# Patient Record
Sex: Female | Born: 1980 | Hispanic: Yes | Marital: Married | Smoking: Never smoker
Health system: Southern US, Community
[De-identification: ages and names within clinical notes are randomized; demographics above are authoritative.]

---

## 2003-06-18 ENCOUNTER — Emergency Department (HOSPITAL_COMMUNITY): Admission: EM | Admit: 2003-06-18 | Discharge: 2003-06-18 | Payer: Self-pay | Admitting: Emergency Medicine

## 2003-06-18 ENCOUNTER — Encounter: Payer: Self-pay | Admitting: Emergency Medicine

## 2005-12-10 ENCOUNTER — Other Ambulatory Visit: Payer: Self-pay

## 2005-12-10 ENCOUNTER — Emergency Department: Payer: Self-pay | Admitting: Emergency Medicine

## 2006-10-25 ENCOUNTER — Emergency Department: Payer: Self-pay

## 2007-01-09 ENCOUNTER — Emergency Department (HOSPITAL_COMMUNITY): Admission: EM | Admit: 2007-01-09 | Discharge: 2007-01-09 | Payer: Self-pay | Admitting: Family Medicine

## 2007-05-07 ENCOUNTER — Emergency Department (HOSPITAL_COMMUNITY): Admission: EM | Admit: 2007-05-07 | Discharge: 2007-05-07 | Payer: Self-pay | Admitting: *Deleted

## 2007-10-21 ENCOUNTER — Emergency Department: Payer: Self-pay | Admitting: Emergency Medicine

## 2008-09-21 ENCOUNTER — Emergency Department: Payer: Self-pay | Admitting: Emergency Medicine

## 2010-09-13 ENCOUNTER — Emergency Department: Payer: Self-pay | Admitting: Unknown Physician Specialty

## 2013-07-29 ENCOUNTER — Ambulatory Visit: Payer: Self-pay | Admitting: Adult Health

## 2015-03-28 ENCOUNTER — Emergency Department: Payer: Self-pay | Admitting: Emergency Medicine

## 2015-03-28 LAB — CBC
HCT: 40.8 % (ref 35.0–47.0)
HGB: 13.3 g/dL (ref 12.0–16.0)
MCH: 28.3 pg (ref 26.0–34.0)
MCHC: 32.6 g/dL (ref 32.0–36.0)
MCV: 87 fL (ref 80–100)
PLATELETS: 407 10*3/uL (ref 150–440)
RBC: 4.69 10*6/uL (ref 3.80–5.20)
RDW: 13.8 % (ref 11.5–14.5)
WBC: 8.5 10*3/uL (ref 3.6–11.0)

## 2015-03-28 LAB — BASIC METABOLIC PANEL
ANION GAP: 8 (ref 7–16)
BUN: 7 mg/dL
CALCIUM: 9.3 mg/dL
CHLORIDE: 106 mmol/L
Co2: 25 mmol/L
Creatinine: 0.74 mg/dL
EGFR (Non-African Amer.): >60
Glucose: 93 mg/dL
Potassium: 4.1 mmol/L
SODIUM: 139 mmol/L

## 2015-03-28 LAB — TROPONIN I: Troponin-I: <0.03 ng/mL

## 2015-10-14 ENCOUNTER — Emergency Department
Admission: EM | Admit: 2015-10-14 | Discharge: 2015-10-14 | Disposition: A | Discharge status: Home / Self Care | Payer: Self-pay | Attending: Emergency Medicine | Admitting: Emergency Medicine

## 2015-10-14 DIAGNOSIS — E669 Obesity, unspecified: Secondary | ICD-10-CM | POA: Insufficient documentation

## 2015-10-14 DIAGNOSIS — E876 Hypokalemia: Secondary | ICD-10-CM | POA: Insufficient documentation

## 2015-10-14 DIAGNOSIS — R110 Nausea: Secondary | ICD-10-CM | POA: Insufficient documentation

## 2015-10-14 DIAGNOSIS — R109 Unspecified abdominal pain: Secondary | ICD-10-CM

## 2015-10-14 DIAGNOSIS — Z3202 Encounter for pregnancy test, result negative: Secondary | ICD-10-CM | POA: Insufficient documentation

## 2015-10-14 DIAGNOSIS — R1084 Generalized abdominal pain: Secondary | ICD-10-CM | POA: Insufficient documentation

## 2015-10-14 DIAGNOSIS — R197 Diarrhea, unspecified: Secondary | ICD-10-CM | POA: Insufficient documentation

## 2015-10-14 LAB — URINALYSIS COMPLETE WITH MICROSCOPIC (ARMC ONLY)
BACTERIA UA: NONE SEEN
Bilirubin Urine: NEGATIVE
Glucose, UA: NEGATIVE mg/dL
LEUKOCYTES UA: NEGATIVE
Nitrite: NEGATIVE
PH: 5 (ref 5.0–8.0)
PROTEIN: 30 mg/dL — AB
Specific Gravity, Urine: 1.029 (ref 1.005–1.030)

## 2015-10-14 LAB — CBC WITH DIFFERENTIAL/PLATELET
Basophils Absolute: 0.1 10*3/uL (ref 0–0.1)
Basophils Relative: 1 %
EOS PCT: 1 %
Eosinophils Absolute: 0.1 10*3/uL (ref 0–0.7)
HCT: 37.9 % (ref 35.0–47.0)
Hemoglobin: 12.5 g/dL (ref 12.0–16.0)
LYMPHS ABS: 2.6 10*3/uL (ref 1.0–3.6)
LYMPHS PCT: 28 %
MCH: 28.2 pg (ref 26.0–34.0)
MCHC: 33.1 g/dL (ref 32.0–36.0)
MCV: 85.2 fL (ref 80.0–100.0)
MONOS PCT: 8 %
Monocytes Absolute: 0.8 10*3/uL (ref 0.2–0.9)
Neutro Abs: 6 10*3/uL (ref 1.4–6.5)
Neutrophils Relative %: 62 %
PLATELETS: 398 10*3/uL (ref 150–440)
RBC: 4.44 MIL/uL (ref 3.80–5.20)
RDW: 13.6 % (ref 11.5–14.5)
WBC: 9.6 10*3/uL (ref 3.6–11.0)

## 2015-10-14 LAB — COMPREHENSIVE METABOLIC PANEL
ALK PHOS: 79 U/L (ref 38–126)
ALT: 14 U/L (ref 14–54)
AST: 22 U/L (ref 15–41)
Albumin: 3.9 g/dL (ref 3.5–5.0)
Anion gap: 8 (ref 5–15)
BUN: 10 mg/dL (ref 6–20)
CALCIUM: 8.9 mg/dL (ref 8.9–10.3)
CHLORIDE: 107 mmol/L (ref 101–111)
CO2: 22 mmol/L (ref 22–32)
CREATININE: 0.7 mg/dL (ref 0.44–1.00)
Glucose, Bld: 117 mg/dL — ABNORMAL HIGH (ref 65–99)
Potassium: 3.2 mmol/L — ABNORMAL LOW (ref 3.5–5.1)
Sodium: 137 mmol/L (ref 135–145)
Total Bilirubin: 0.2 mg/dL — ABNORMAL LOW (ref 0.3–1.2)
Total Protein: 7.6 g/dL (ref 6.5–8.1)

## 2015-10-14 LAB — LIPASE, BLOOD: LIPASE: 35 U/L (ref 22–51)

## 2015-10-14 LAB — PREGNANCY, URINE: Preg Test, Ur: NEGATIVE

## 2015-10-14 MED ORDER — SIMETHICONE 80 MG PO CHEW: 80.0000 mg | CHEWABLE_TABLET | Freq: Four times a day (QID) | ORAL | Status: DC | PRN

## 2015-10-14 MED ORDER — POTASSIUM CHLORIDE CRYS ER 20 MEQ PO TBCR
40.0000 meq | EXTENDED_RELEASE_TABLET | Freq: Once | ORAL | Status: AC
  Administered 2015-10-14: 40 meq via ORAL

## 2015-10-14 MED ORDER — POTASSIUM CHLORIDE CRYS ER 20 MEQ PO TBCR
EXTENDED_RELEASE_TABLET | ORAL | Status: AC
  Administered 2015-10-14: 40 meq via ORAL
  Filled 2015-10-14: qty 2

## 2015-10-14 MED ORDER — KETOROLAC TROMETHAMINE 30 MG/ML IJ SOLN: 60.0000 mg | Freq: Once | INTRAMUSCULAR | Status: AC

## 2015-10-14 MED ORDER — KETOROLAC TROMETHAMINE 60 MG/2ML IM SOLN
INTRAMUSCULAR | Status: AC
  Administered 2015-10-14: 60 mg
  Filled 2015-10-14: qty 2

## 2015-10-14 MED ORDER — ONDANSETRON HCL 4 MG PO TABS: 4.0000 mg | ORAL_TABLET | Freq: Every day | ORAL | Status: AC | PRN

## 2015-10-14 MED ORDER — LOPERAMIDE HCL 2 MG PO TABS: 2.0000 mg | ORAL_TABLET | Freq: Four times a day (QID) | ORAL | Status: DC | PRN

## 2015-10-14 MED ORDER — KETOROLAC TROMETHAMINE 30 MG/ML IJ SOLN
30.0000 mg | Freq: Once | INTRAMUSCULAR | Status: DC
Start: 2015-10-14 — End: 2015-10-14

## 2015-10-14 NOTE — ED Notes (Signed)
Pt states left sided abd pain, states nausea, denies any vomiting

## 2015-10-14 NOTE — ED Notes (Signed)
Patient comes in complaining of abdominal pain that started 1 week ago and got worse today.  Pain is on the left upper quadrant and radiates to right side.  Patient with multiple loose stools, nausea with NO vomiting.

## 2015-10-14 NOTE — ED Provider Notes (Signed)
[facility] Emergency Department Provider Note  ____________________________________________  Time seen: Approximately 2:58 PM  I have reviewed the triage vital signs and the nursing notes.   HISTORY  Chief Complaint Abdominal Pain    HPI Tiffany Wu is a 34 y.o. female , status post BTL, presenting with left-sided abdominal pain. Patient reports that for the last week she has had 4-5 daily episodes of a sharp left-sided pain that lasts for several seconds and then resolves spontaneously. It is associated with nausea but no vomiting, with nonbloody diarrhea. She denies any food sensitivities, fever, chills, urinary symptoms including burning or increased frequency, change in vaginal discharge, no chest pain or shortness of breath..Patient is not having any pain or nausea at this time.   History reviewed. No pertinent past medical history.  There are no active problems to display for this patient.   History reviewed. No pertinent past surgical history.  Current Outpatient Rx  Name  Route  Sig  Dispense  Refill  . loperamide (IMODIUM A-D) 2 MG tablet   Oral   Take 1 tablet (2 mg total) by mouth 4 (four) times daily as needed for diarrhea or loose stools.   12 tablet   0   . ondansetron (ZOFRAN) 4 MG tablet   Oral   Take 1 tablet (4 mg total) by mouth daily as needed for nausea or vomiting.   15 tablet   0     Allergies Review of patient's allergies indicates no known allergies.  History reviewed. No pertinent family history.  Social History Social History  Substance Use Topics  . Smoking status: Never Smoker   . Smokeless tobacco: None  . Alcohol Use: No   patient is sexually active; status post BTL  Review of Systems Constitutional: No fever/chills. No syncope. Eyes: No visual changes. ENT: No sore throat. Cardiovascular: Denies chest pain, palpitations. Respiratory: Denies shortness of breath.  No cough. Gastrointestinal:  Positive abdominal pain.  Positive nausea without vomiting. Positive nonbloody diarrhea..  No constipation. Genitourinary: Negative for dysuria. Negative for urinary frequency, negative for change in vaginal discharge. Musculoskeletal: Negative for back pain. Skin: Negative for rash. Neurological: Negative for headaches, focal weakness or numbness.  10-point ROS otherwise negative.  ____________________________________________   PHYSICAL EXAM:  VITAL SIGNS: ED Triage Vitals  Enc Vitals Group     BP 10/14/15 1411 144/95 mmHg     Pulse Rate 10/14/15 1411 92     Resp 10/14/15 1411 16     Temp 10/14/15 1411 98.6 F (37 C)     Temp Source 10/14/15 1411 Oral     SpO2 10/14/15 1411 99 %     Weight 10/14/15 1411 200 lb (90.719 kg)     Height 10/14/15 1411 5\' 2"  (1.575 m)     Head Cir --      Peak Flow --      Pain Score 10/14/15 1411 8     Pain Loc --      Pain Edu? --      Excl. in GC? --     Constitutional: Alert and oriented. Well appearing and in no acute distress. Answer question appropriately. Eyes: Conjunctivae are normal.  EOMI. no scleral icterus. Head: Atraumatic. Nose: No congestion/rhinnorhea. Mouth/Throat: Mucous membranes are moist.  Neck: No stridor.  Supple.   Cardiovascular: Normal rate, regular rhythm. No murmurs, rubs or gallops.  Respiratory: Normal respiratory effort.  No retractions. Lungs CTAB.  No wheezes, rales or ronchi. Gastrointestinal: Obese, soft, nondistended.  Diffuse nonfocal tenderness in the left side, left lower quadrant greater than left upper quadrant. Musculoskeletal: No LE edema.  Neurologic:  Normal speech and language. No gross focal neurologic deficits are appreciated.  Skin:  Skin is warm, dry and intact. No rash noted. Psychiatric: Mood and affect are normal. Speech and behavior are normal.  Normal judgement.  ____________________________________________   LABS (all labs ordered are listed, but only abnormal results are  displayed)  Labs Reviewed  COMPREHENSIVE METABOLIC PANEL - Abnormal; Notable for the following:    Potassium 3.2 (*)    Glucose, Bld 117 (*)    Total Bilirubin 0.2 (*)    All other components within normal limits  URINALYSIS COMPLETEWITH MICROSCOPIC ([facility] ONLY) - Abnormal; Notable for the following:    Color, Urine YELLOW (*)    APPearance HAZY (*)    Ketones, ur TRACE (*)    Hgb urine dipstick 1+ (*)    Protein, ur 30 (*)    Squamous Epithelial / LPF 0-5 (*)    All other components within normal limits  CBC WITH DIFFERENTIAL/PLATELET  LIPASE, BLOOD  PREGNANCY, URINE   ____________________________________________  EKG  Not indicated ____________________________________________  RADIOLOGY  No results found.  ____________________________________________   PROCEDURES  Procedure(s) performed: None  Critical Care performed: No ____________________________________________   INITIAL IMPRESSION / ASSESSMENT AND PLAN / ED COURSE  Pertinent labs & imaging results that were available during my care of the patient were reviewed by me and considered in my medical decision making (see chart for details).  34 y.o. female, status post BTL, presenting with left-sided abdominal pain. It is worse when she eats. Given the brevity of the episodes, consider a food sensitivity, as well as gas. Diverticulitis is possible but less likely. No evidence of appendicitis or gallbladder disease. We'll get lab work and if it is reassuring, the patient started simethicone and follow up with her PMD. If the labs are abnormal, we will consider additional workup at that time. At this time the patient is symptom-free so no further treatment is indicated.  ----------------------------------------- 3:43 PM on 10/14/2015 -----------------------------------------  Patient is well-appearing and I have low clinical suspicion for an acute surgical or admissible intra-abdominal pathology. The patient has  labs that are reassuring with a normal white blood cell count. She is mildly hypokalemic so did supplement that. She does not have a UTI.  I'll plan to send her home with antidiarrheal, anti-emetic, and instructions to follow up with her primary care physician. ____________________________________________  FINAL CLINICAL IMPRESSION(S) / ED DIAGNOSES  Final diagnoses:  Left sided abdominal pain  Hypokalemia  Diarrhea, unspecified type  Nausea      NEW MEDICATIONS STARTED DURING THIS VISIT:  New Prescriptions   LOPERAMIDE (IMODIUM A-D) 2 MG TABLET    Take 1 tablet (2 mg total) by mouth 4 (four) times daily as needed for diarrhea or loose stools.   ONDANSETRON (ZOFRAN) 4 MG TABLET    Take 1 tablet (4 mg total) by mouth daily as needed for nausea or vomiting.     Rockne Menghini, MD 10/14/15 1544

## 2015-10-14 NOTE — Discharge Instructions (Signed)
[facility] call the [facility] to make an appointment to establish a primary care physician and to discuss the follow-up of her abdominal pain. Your potassium was low today and will need to be rechecked as well.  Please return to the emergency department if he develops worsened abdominal pain, fever, inability to keep down fluids, lightheadedness or fainting, or any other symptoms concerning to you.  Abdominal Pain, Adult Many things can cause abdominal pain. Usually, abdominal pain is not caused by a disease and will improve without treatment. It can often be observed and treated at home. Your health care provider will do a physical exam and possibly order blood tests and X-rays to help determine the seriousness of your pain. However, in many cases, more time must pass before a clear cause of the pain can be found. Before that point, your health care provider may not know if you need more testing or further treatment. HOME CARE INSTRUCTIONS Monitor your abdominal pain for any changes. The following actions may help to alleviate any discomfort you are experiencing:  Only take over-the-counter or prescription medicines as directed by your health care provider.  Do not take laxatives unless directed to do so by your health care provider.  Try a clear liquid diet (broth, tea, or water) as directed by your health care provider. Slowly move to a bland diet as tolerated. SEEK MEDICAL CARE IF:  You have unexplained abdominal pain.  You have abdominal pain associated with nausea or diarrhea.  You have pain when you urinate or have a bowel movement.  You experience abdominal pain that wakes you in the night.  You have abdominal pain that is worsened or improved by eating food.  You have abdominal pain that is worsened with eating fatty foods.  You have a fever. SEEK IMMEDIATE MEDICAL CARE IF:  Your pain does not go away within 2 hours.  You keep throwing up (vomiting).  Your pain is felt  only in portions of the abdomen, such as the right side or the left lower portion of the abdomen.  You pass bloody or black tarry stools. MAKE SURE YOU:  Understand these instructions.  Will watch your condition.  Will get help right away if you are not doing well or get worse.   This information is not intended to replace advice given to you by your health care provider. Make sure you discuss any questions you have with your health care provider.   Document Released: 09/26/2005 Document Revised: 09/07/2015 Document Reviewed: 08/26/2013 Elsevier Interactive Patient Education [employer]2016 [employer].

## 2016-09-02 ENCOUNTER — Emergency Department: Payer: BLUE CROSS/BLUE SHIELD

## 2016-09-02 ENCOUNTER — Emergency Department
Admission: EM | Admit: 2016-09-02 | Discharge: 2016-09-02 | Disposition: A | Discharge status: Home / Self Care | Payer: BLUE CROSS/BLUE SHIELD | Attending: Emergency Medicine | Admitting: Emergency Medicine

## 2016-09-02 ENCOUNTER — Encounter: Payer: Self-pay | Admitting: Emergency Medicine

## 2016-09-02 DIAGNOSIS — R079 Chest pain, unspecified: Secondary | ICD-10-CM | POA: Diagnosis not present

## 2016-09-02 DIAGNOSIS — R0789 Other chest pain: Secondary | ICD-10-CM | POA: Diagnosis present

## 2016-09-02 LAB — CBC
HCT: 36.9 % (ref 35.0–47.0)
HEMOGLOBIN: 12.8 g/dL (ref 12.0–16.0)
MCH: 29.4 pg (ref 26.0–34.0)
MCHC: 34.6 g/dL (ref 32.0–36.0)
MCV: 85.2 fL (ref 80.0–100.0)
Platelets: 377 10*3/uL (ref 150–440)
RBC: 4.34 MIL/uL (ref 3.80–5.20)
RDW: 13.6 % (ref 11.5–14.5)
WBC: 9 10*3/uL (ref 3.6–11.0)

## 2016-09-02 LAB — BASIC METABOLIC PANEL
Anion gap: 4 — ABNORMAL LOW (ref 5–15)
BUN: 9 mg/dL (ref 6–20)
CALCIUM: 9 mg/dL (ref 8.9–10.3)
CO2: 26 mmol/L (ref 22–32)
CREATININE: 0.72 mg/dL (ref 0.44–1.00)
Chloride: 108 mmol/L (ref 101–111)
GFR calc non Af Amer: >60 mL/min (ref >60)
GLUCOSE: 95 mg/dL (ref 65–99)
POTASSIUM: 3.7 mmol/L (ref 3.5–5.1)
SODIUM: 138 mmol/L (ref 135–145)

## 2016-09-02 LAB — TROPONIN I

## 2016-09-02 LAB — POCT PREGNANCY, URINE: Preg Test, Ur: NEGATIVE

## 2016-09-02 MED ORDER — KETOROLAC TROMETHAMINE 60 MG/2ML IM SOLN
60.0000 mg | Freq: Once | INTRAMUSCULAR | Status: AC
  Administered 2016-09-02: 60 mg via INTRAMUSCULAR
  Filled 2016-09-02: qty 2

## 2016-09-02 MED ORDER — RANITIDINE HCL 150 MG PO TABS: 150.0000 mg | ORAL_TABLET | Freq: Two times a day (BID) | ORAL | 1 refills | Status: DC

## 2016-09-02 MED ORDER — GI COCKTAIL ~~LOC~~
30.0000 mL | Freq: Once | ORAL | Status: AC
  Administered 2016-09-02: 30 mL via ORAL
  Filled 2016-09-02: qty 30

## 2016-09-02 MED ORDER — SUCRALFATE 1 G PO TABS: 1.0000 g | ORAL_TABLET | Freq: Four times a day (QID) | ORAL | 0 refills | Status: DC

## 2016-09-02 NOTE — Discharge Instructions (Signed)
Please seek medical attention for any high fevers, chest pain, shortness of breath, change in behavior, persistent vomiting, bloody stool or any other new or concerning symptoms.  

## 2016-09-02 NOTE — ED Triage Notes (Signed)
Pt with right chest pain (pressure) 8/10 that started today at 0100. Deep breathing, laying down makes pain worse.

## 2016-09-02 NOTE — ED Provider Notes (Signed)
[facility] Emergency Department Provider Note   ____________________________________________   I have reviewed the triage vital signs and the nursing notes.   HISTORY  Chief Complaint Chest Pain   History limited by: Not Limited   HPI Tiffany Wu is a 35 y.o. female who presents to the emergency department today because of concerns for right sided chest pain that started roughly 14 hours ago. She describes it as pressure-like. No left-sided pain. Somewhat worse with deep breaths. Not affected by position or eating. No shortness of breath. No fevers. No upper or lower extremity swelling. No recent travel. No history of blood clots. Patient is not on any estrogen.    History reviewed. No pertinent past medical history.  There are no active problems to display for this patient.   History reviewed. No pertinent surgical history.  Prior to Admission medications   Medication Sig Start Date End Date Taking? Authorizing Provider  loperamide (IMODIUM A-D) 2 MG tablet Take 1 tablet (2 mg total) by mouth 4 (four) times daily as needed for diarrhea or loose stools. 10/14/15   Anne-Caroline Sharma Covert, MD  ondansetron (ZOFRAN) 4 MG tablet Take 1 tablet (4 mg total) by mouth daily as needed for nausea or vomiting. 10/14/15 10/13/16  Anne-Caroline Sharma Covert, MD  simethicone (GAS-X) 80 MG chewable tablet Chew 1 tablet (80 mg total) by mouth 4 (four) times daily as needed for flatulence. 10/14/15 10/13/16  Rockne Menghini, MD    Allergies Review of patient's allergies indicates no known allergies.  History reviewed. No pertinent family history.  Social History Social History  Substance Use Topics  . Smoking status: Never Smoker  . Smokeless tobacco: Not on file  . Alcohol use No    Review of Systems  Constitutional: Negative for fever. Cardiovascular: Positive for right-sided chest pain Respiratory: Negative for shortness of breath. Gastrointestinal:  Negative for abdominal pain, vomiting and diarrhea. Genitourinary: Negative for dysuria. Musculoskeletal: Negative for back pain. Skin: Negative for rash. Neurological: Negative for headaches, focal weakness or numbness.  10-point ROS otherwise negative.  ____________________________________________   PHYSICAL EXAM:  VITAL SIGNS: ED Triage Vitals  Enc Vitals Group     BP 09/02/16 1305 (!) 145/94     Pulse Rate 09/02/16 1305 85     Resp 09/02/16 1429 16     Temp 09/02/16 1305 98.5 F (36.9 C)     Temp Source 09/02/16 1305 Oral     SpO2 09/02/16 1305 98 %     Weight 09/02/16 1306 198 lb (89.8 kg)     Height 09/02/16 1306 5\' 2"  (1.575 m)     Head Circumference --      Peak Flow --      Pain Score 09/02/16 1306 8   Constitutional: Alert and oriented. Well appearing and in no distress. Eyes: Conjunctivae are normal. Normal extraocular movements. ENT   Head: Normocephalic and atraumatic.   Nose: No congestion/rhinnorhea.   Mouth/Throat: Mucous membranes are moist.   Neck: No stridor. Hematological/Lymphatic/Immunilogical: No cervical lymphadenopathy. Cardiovascular: Normal rate, regular rhythm.  No murmurs, rubs, or gallops. Respiratory: Normal respiratory effort without tachypnea nor retractions. Breath sounds are clear and equal bilaterally. No wheezes/rales/rhonchi. Gastrointestinal: Soft and nontender. No distention.  Genitourinary: Deferred Musculoskeletal: Normal range of motion in all extremities. No lower extremity edema. Neurologic:  Normal speech and language. No gross focal neurologic deficits are appreciated.  Skin:  Skin is warm, dry and intact. No rash noted. Psychiatric: Mood and affect are normal. Speech and behavior  are normal. Patient exhibits appropriate insight and judgment.  ____________________________________________    LABS (pertinent positives/negatives)  Labs Reviewed  BASIC METABOLIC PANEL - Abnormal; Notable for the following:        Result Value   Anion gap 4 (*)    All other components within normal limits  CBC  TROPONIN I     ____________________________________________   EKG  I, Phineas SemenGraydon Vila Dory, attending physician, personally viewed and interpreted this EKG  EKG Time: 1253 Rate: 86 Rhythm: normal sinus rhythm Axis: normal Intervals: qtc 423 QRS: narrow ST changes: no st elevation Impression: normal ekg   ____________________________________________    RADIOLOGY  CXR IMPRESSION:  No evidence of acute cardiopulmonary disease.   __________________________________________   PROCEDURES  Procedures  ____________________________________________   INITIAL IMPRESSION / ASSESSMENT AND PLAN / ED COURSE  Pertinent labs & imaging results that were available during my care of the patient were reviewed by me and considered in my medical decision making (see chart for details).  Patient presented to the emergency department today with right-sided chest pain. Chest x-ray EKG and blood work without concerning findings. Patient is perc negative.  Clinical Course   Patient felt better after GI cocktail. Will plan on discharging with an acid as well as sucralfate. ____________________________________________   FINAL CLINICAL IMPRESSION(S) / ED DIAGNOSES  Final diagnoses:  Nonspecific chest pain     Note: This dictation was prepared with Dragon dictation. Any transcriptional errors that result from this process are unintentional    Phineas SemenGraydon Beaumont Austad, MD 09/02/16 1712

## 2017-06-16 ENCOUNTER — Encounter: Payer: Self-pay | Admitting: Emergency Medicine

## 2017-06-16 ENCOUNTER — Emergency Department: Payer: BLUE CROSS/BLUE SHIELD

## 2017-06-16 ENCOUNTER — Emergency Department
Admission: EM | Admit: 2017-06-16 | Discharge: 2017-06-16 | Disposition: A | Discharge status: Home / Self Care | Payer: BLUE CROSS/BLUE SHIELD | Attending: Emergency Medicine | Admitting: Emergency Medicine

## 2017-06-16 DIAGNOSIS — M79601 Pain in right arm: Secondary | ICD-10-CM | POA: Diagnosis present

## 2017-06-16 DIAGNOSIS — R202 Paresthesia of skin: Secondary | ICD-10-CM | POA: Diagnosis not present

## 2017-06-16 DIAGNOSIS — R200 Anesthesia of skin: Secondary | ICD-10-CM | POA: Insufficient documentation

## 2017-06-16 DIAGNOSIS — G5601 Carpal tunnel syndrome, right upper limb: Secondary | ICD-10-CM | POA: Insufficient documentation

## 2017-06-16 MED ORDER — KETOROLAC TROMETHAMINE 30 MG/ML IJ SOLN
30.0000 mg | Freq: Once | INTRAMUSCULAR | Status: AC
  Administered 2017-06-16: 30 mg via INTRAMUSCULAR
  Filled 2017-06-16: qty 1

## 2017-06-16 MED ORDER — NAPROXEN 500 MG PO TABS: 500.0000 mg | ORAL_TABLET | Freq: Two times a day (BID) | ORAL | 0 refills | Status: AC

## 2017-06-16 NOTE — ED Provider Notes (Signed)
[facility] Emergency Department Provider Note  ____________________________________________   First MD Initiated Contact with Patient 06/16/17 1957     (approximate)  I have reviewed the triage vital signs and the nursing notes.   HISTORY  Chief Complaint Numbness   HPI Tiffany Wu is a 36 y.o. female without any chronic medical conditions was presenting with right upper extremity pain and numbness. She says that her pain at this time is moderate to severe to the right palm. She says that she also has a shooting pain from her right shoulder with movement of the right upper extremity. She says this pain over the last for seconds and then goes away. 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 However, she has never had these symptoms before. Denies any injury to the right upper extremity. Denies any swelling to the right upper extremity. Denies any shortness of breath or chest pain. No history of cardiac disease or blood clots.   No past medical history on file.  There are no active problems to display for this patient.   No past surgical history on file.  Prior to Admission medications   Medication Sig Start Date End Date Taking? Authorizing Provider  loperamide (IMODIUM A-D) 2 MG tablet Take 1 tablet (2 mg total) by mouth 4 (four) times daily as needed for diarrhea or loose stools. 10/14/15   Rockne MenghiniNorman, Anne-Caroline, MD  ranitidine (ZANTAC) 150 MG tablet Take 1 tablet (150 mg total) by mouth 2 (two) times daily. 09/02/16 09/02/17  Phineas SemenGoodman, Graydon, MD  simethicone (GAS-X) 80 MG chewable tablet Chew 1 tablet (80 mg total) by mouth 4 (four) times daily as needed for flatulence. 10/14/15 10/13/16  Rockne MenghiniNorman, Anne-Caroline, MD  sucralfate (CARAFATE) 1 g tablet Take 1 tablet (1 g total) by mouth 4 (four) times daily. 09/02/16   Phineas SemenGoodman, Graydon, MD    Allergies Patient has no known allergies.  No family history on  file.  Social History Social History  Substance Use Topics  . Smoking status: Never Smoker  . Smokeless tobacco: Not on file  . Alcohol use No    Review of Systems  Constitutional: No fever/chills Eyes: No visual changes. ENT: No sore throat. Cardiovascular: Denies chest pain. Respiratory: Denies shortness of breath. Gastrointestinal: No abdominal pain.  No nausea, no vomiting.  No diarrhea.  No constipation. Genitourinary: Negative for dysuria. Musculoskeletal: Negative for back pain. Skin: Negative for rash. Neurological: Negative for headaches, focal weakness   ____________________________________________   PHYSICAL EXAM:  VITAL SIGNS: ED Triage Vitals  Enc Vitals Group     BP 06/16/17 1952 (!) 139/91     Pulse Rate 06/16/17 1952 92     Resp 06/16/17 1952 20     Temp 06/16/17 1952 98.6 F (37 C)     Temp Source 06/16/17 1952 Oral     SpO2 06/16/17 1952 96 %     Weight 06/16/17 1953 180 lb (81.6 kg)     Height 06/16/17 1953 5\' 2"  (1.575 m)     Head Circumference --      Peak Flow --      Pain Score 06/16/17 1951 8     Pain Loc --      Pain Edu? --      Excl. in GC? --     Constitutional: Alert and oriented. Well appearing and in no acute distress. Eyes: Conjunctivae are normal.  Head: Atraumatic. Nose: No  congestion/rhinnorhea. Mouth/Throat: Mucous membranes are moist.  Neck: No stridor.   Cardiovascular: Normal rate, regular rhythm. Grossly normal heart sounds.  Good peripheral circulation With intact radial pulse to the right side. Respiratory: Normal respiratory effort.  No retractions. Lungs CTAB. Gastrointestinal: Soft and nontender. No distention. No CVA tenderness. Musculoskeletal: No lower extremity tenderness nor edema.  No joint effusions.  Right upper extremity without swelling to the arm or forearm. However, there is swelling over the palmar aspect of the right hand. This area is very tender with a positive Tinel sign. Also positive Phalen  sign. Patient able to range all 5 fingers to the right upper extremity. Does have pain to the right shoulder with range of motion. No tenderness to palpation to the neck or the trapezius muscles. No deformity to the right shoulder.  Neurologic:  Normal speech and language. No gross focal neurologic deficits are appreciated. Skin:  Skin is warm, dry and intact. No rash noted. Psychiatric: Mood and affect are normal. Speech and behavior are normal.  ____________________________________________   LABS (all labs ordered are listed, but only abnormal results are displayed)  Labs Reviewed - No data to display ____________________________________________  EKG   ____________________________________________  RADIOLOGY  Patient with a negative right hand x-ray. ____________________________________________   PROCEDURES  Procedure(s) performed:   Procedures  Critical Care performed:   ____________________________________________   INITIAL IMPRESSION / ASSESSMENT AND PLAN / ED COURSE  Pertinent labs & imaging results that were available during my care of the patient were reviewed by me and considered in my medical decision making (see chart for details).  Suspect carpal tunnel syndrome.    ----------------------------------------- 10:39 PM on 06/16/2017 -----------------------------------------  Patient at this time without pain after splinting and a spica splint as well as Toradol. Likely nerve impingement with radiculopathy and carpal tunnel. No edema. Unlikely to be a blood clot. However, I did discuss with the patient and if she expresses any swelling of the upper extremity to return immediately to the hospital. She will wear a thumb spica splint. She'll follow-up at the [facility] clinic and will also use Aleve twice a day over the next 5 days. She is understanding of this plan and willing to comply. Also now able to range the right shoulder without any  issue.  ____________________________________________   FINAL CLINICAL IMPRESSION(S) / ED DIAGNOSES  Paresthesia. Carpal tunnel.    NEW MEDICATIONS STARTED DURING THIS VISIT:  New Prescriptions   No medications on file     Note:  This document was prepared using Dragon voice recognition software and may include unintentional dictation errors.     Myrna Blazer, MD 06/16/17 2240

## 2017-06-16 NOTE — ED Notes (Signed)
XR at bedside

## 2017-06-16 NOTE — ED Notes (Signed)
ED Provider at bedside. 

## 2017-06-16 NOTE — ED Triage Notes (Signed)
Patient reports left arm numbness and pain since yesterday morning.  Reports happens off/on states arm feels numb/tingling then feeling returns and it's very painful.

## 2017-06-16 NOTE — ED Notes (Signed)
Thumb spica changed from fiberglass to Velcro type per MD order.  Patient requested to take the old spica cast home.

## 2017-08-31 IMAGING — CR DG CHEST 2V
1 series · 2 of 2 positions shown · non-contrast
Comparison: 03/28/2015

CLINICAL DATA: 34-year-old female with a history of chest pain

EXAM:
CHEST  2 VIEW

[Series 1: dg chest 2 view · 0.14mm/px · 2 of 2 slices shown]
[im 1/2]
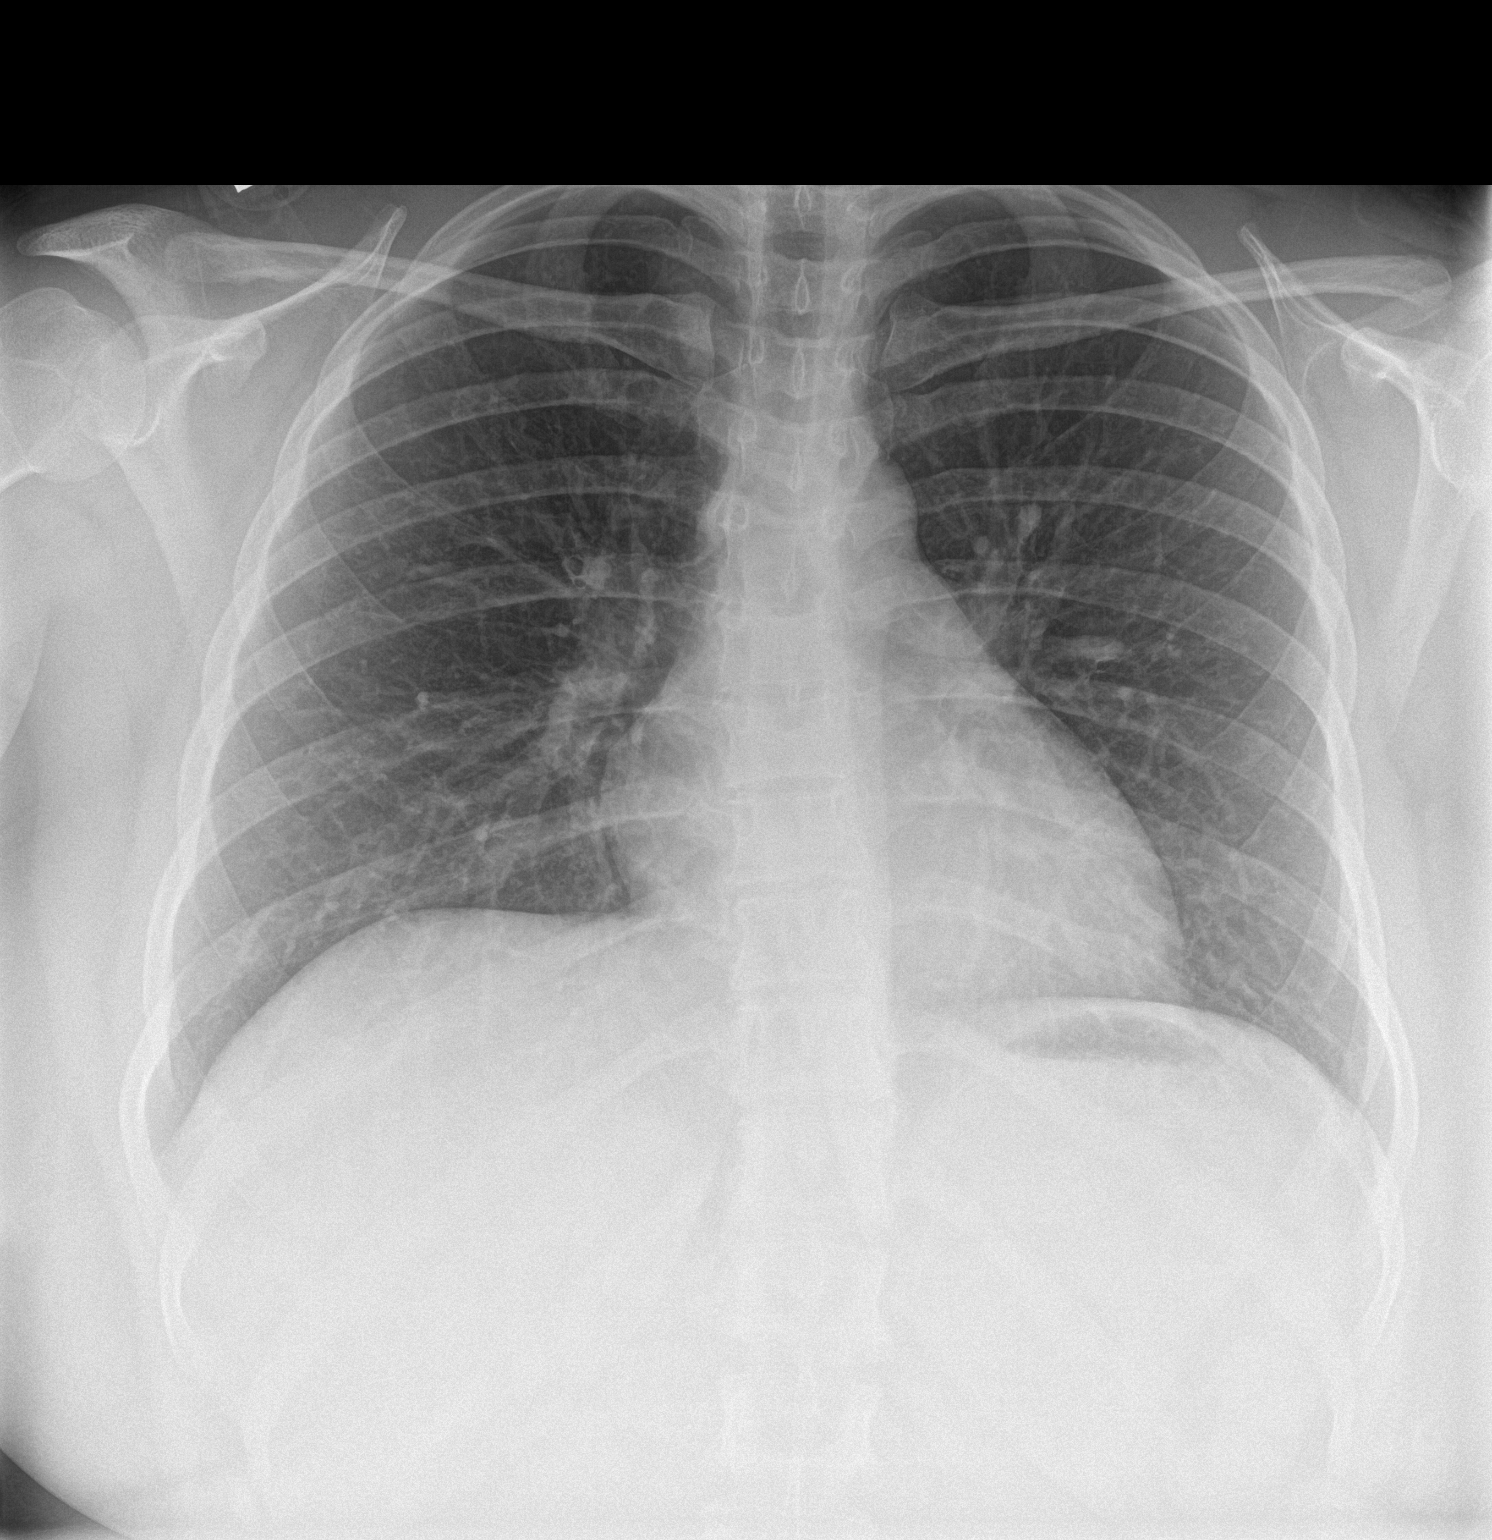
[im 2/2]
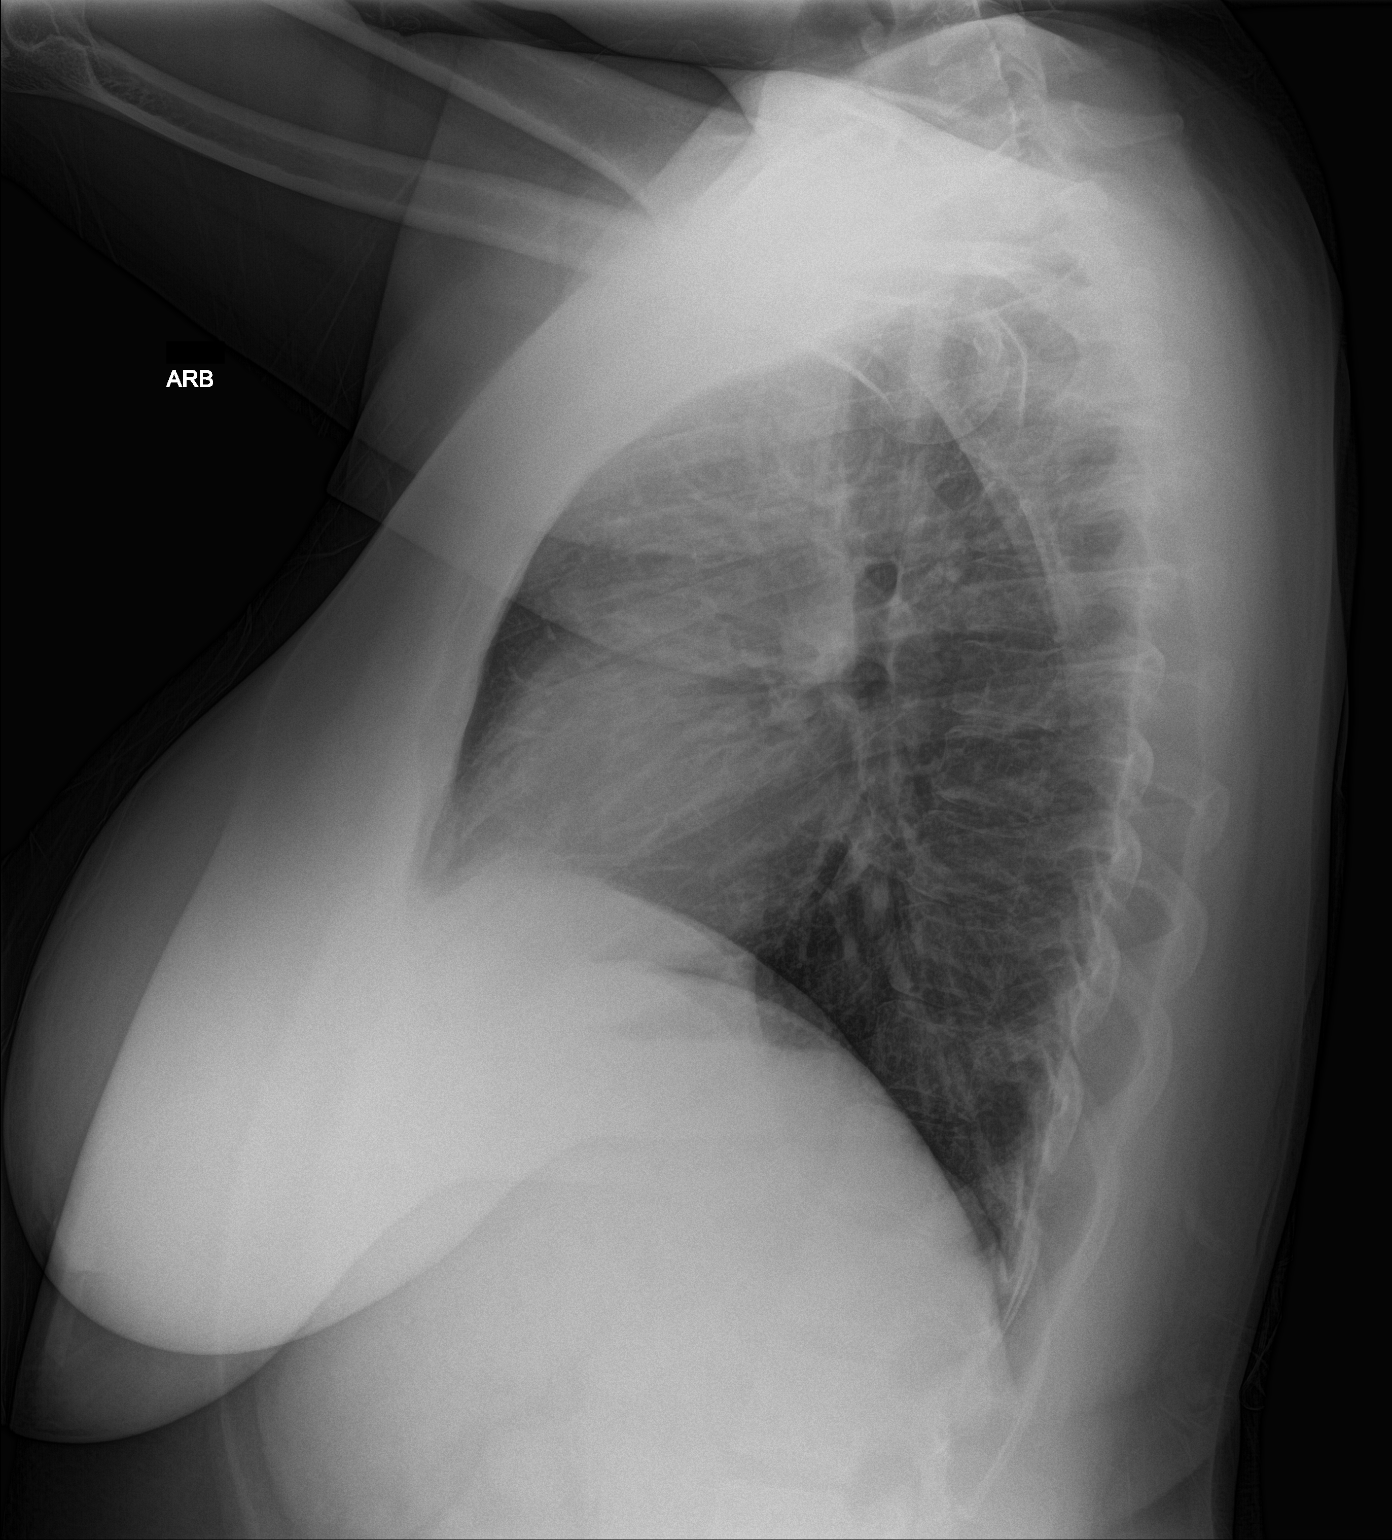

[2 of 2 positions shown; findings below may reference images not displayed]

FINDINGS: The heart size and mediastinal contours are within normal limits.
Both lungs are clear. The visualized skeletal structures are
unremarkable.
IMPRESSION: No evidence of acute cardiopulmonary disease.

## 2018-07-24 ENCOUNTER — Emergency Department: Payer: BLUE CROSS/BLUE SHIELD

## 2018-07-24 ENCOUNTER — Other Ambulatory Visit: Payer: Self-pay

## 2018-07-24 ENCOUNTER — Emergency Department
Admission: EM | Admit: 2018-07-24 | Discharge: 2018-07-24 | Disposition: A | Discharge status: Home / Self Care | Payer: BLUE CROSS/BLUE SHIELD | Attending: Emergency Medicine | Admitting: Emergency Medicine

## 2018-07-24 DIAGNOSIS — R0789 Other chest pain: Secondary | ICD-10-CM | POA: Insufficient documentation

## 2018-07-24 DIAGNOSIS — Z79899 Other long term (current) drug therapy: Secondary | ICD-10-CM | POA: Diagnosis not present

## 2018-07-24 LAB — BASIC METABOLIC PANEL
Anion gap: 7 (ref 5–15)
BUN: 9 mg/dL (ref 6–20)
CALCIUM: 8.9 mg/dL (ref 8.9–10.3)
CO2: 26 mmol/L (ref 22–32)
CREATININE: 0.79 mg/dL (ref 0.44–1.00)
Chloride: 106 mmol/L (ref 98–111)
GFR calc non Af Amer: >60 mL/min (ref >60)
Glucose, Bld: 105 mg/dL — ABNORMAL HIGH (ref 70–99)
Potassium: 3.3 mmol/L — ABNORMAL LOW (ref 3.5–5.1)
SODIUM: 139 mmol/L (ref 135–145)

## 2018-07-24 LAB — HEPATIC FUNCTION PANEL
ALT: 15 U/L (ref 0–44)
AST: 21 U/L (ref 15–41)
Albumin: 3.6 g/dL (ref 3.5–5.0)
Alkaline Phosphatase: 70 U/L (ref 38–126)
BILIRUBIN TOTAL: 0.4 mg/dL (ref 0.3–1.2)
Total Protein: 7.2 g/dL (ref 6.5–8.1)

## 2018-07-24 LAB — CBC
HCT: 35.5 % (ref 35.0–47.0)
Hemoglobin: 11.8 g/dL — ABNORMAL LOW (ref 12.0–16.0)
MCH: 28.4 pg (ref 26.0–34.0)
MCHC: 33.4 g/dL (ref 32.0–36.0)
MCV: 85.1 fL (ref 80.0–100.0)
PLATELETS: 462 10*3/uL — AB (ref 150–440)
RBC: 4.17 MIL/uL (ref 3.80–5.20)
RDW: 14.7 % — AB (ref 11.5–14.5)
WBC: 9.2 10*3/uL (ref 3.6–11.0)

## 2018-07-24 LAB — TROPONIN I

## 2018-07-24 LAB — LIPASE, BLOOD: Lipase: 38 U/L (ref 11–51)

## 2018-07-24 MED ORDER — NAPROXEN 375 MG PO TABS
375.0000 mg | ORAL_TABLET | Freq: Once | ORAL | Status: AC
  Administered 2018-07-24: 375 mg via ORAL
  Filled 2018-07-24: qty 1

## 2018-07-24 NOTE — Discharge Instructions (Addendum)
Return to the emergency room new or worsening symptoms. Follow up closely with pcp. Take tylenol and motrin as needed.

## 2018-07-24 NOTE — ED Provider Notes (Signed)
[facility] Emergency Department Provider Note  ____________________________________________   I have reviewed the triage vital signs and the nursing notes. Where available I have reviewed prior notes and, if possible and indicated, outside hospital notes.    HISTORY  Chief Complaint Chest Pain    HPI Tiffany Wu is a 37 y.o. female who presents today complaining of chest wall pain.  Is been there since this morning when she turned the car.  She states that it is better when she lies still is worse when she touches it or changes position.  She denies any pleuritic pain.  Is in the superficial muscles the left chest wall.  She denies any abdominal pain or vomiting.  She has had something similar before.  It started this morning around 830 has been there every minute of the day since that time.  No radiation to the back, not at maximum intensity at onset or tearing, gradually worse.  Denies any arm or leg swelling.  No personal family history of PE DVT new, no recent travel no leg swelling, no recent surgery, not on exogenous estrogens,      History reviewed. No pertinent past medical history.  There are no active problems to display for this patient.   Past Surgical History:  Procedure Laterality Date  . CESAREAN SECTION      Prior to Admission medications   Medication Sig Start Date End Date Taking? Authorizing Provider  loperamide (IMODIUM A-D) 2 MG tablet Take 1 tablet (2 mg total) by mouth 4 (four) times daily as needed for diarrhea or loose stools. 10/14/15   Rockne Menghini, MD  ranitidine (ZANTAC) 150 MG tablet Take 1 tablet (150 mg total) by mouth 2 (two) times daily. 09/02/16 09/02/17  Phineas Semen, MD  simethicone (GAS-X) 80 MG chewable tablet Chew 1 tablet (80 mg total) by mouth 4 (four) times daily as needed for flatulence. 10/14/15 10/13/16  Rockne Menghini, MD  sucralfate (CARAFATE) 1 g tablet Take 1 tablet (1 g total) by  mouth 4 (four) times daily. 09/02/16   Phineas Semen, MD    Allergies Patient has no known allergies.  History reviewed. No pertinent family history.  Social History Social History   Tobacco Use  . Smoking status: Never Smoker  Substance Use Topics  . Alcohol use: No  . Drug use: No    Review of Systems Constitutional: No fever/chills Eyes: No visual changes. ENT: No sore throat. No stiff neck no neck pain Cardiovascular: De+ nies chest pain. Respiratory: Denies shortness of breath. Gastrointestinal:   no vomiting.  No diarrhea.  No constipation. Genitourinary: Negative for dysuria. Musculoskeletal: Negative lower extremity swelling Skin: Negative for rash. Neurological: Negative for severe headaches, focal weakness or numbness.   ____________________________________________   PHYSICAL EXAM:  VITAL SIGNS: ED Triage Vitals  Enc Vitals Group     BP 07/24/18 1515 129/79     Pulse Rate 07/24/18 1515 89     Resp 07/24/18 1515 16     Temp 07/24/18 1515 98.2 F (36.8 C)     Temp Source 07/24/18 1515 Oral     SpO2 07/24/18 1515 99 %     Weight 07/24/18 1516 200 lb (90.7 kg)     Height 07/24/18 1516 5\' 2"  (1.575 m)     Head Circumference --      Peak Flow --      Pain Score 07/24/18 1516 9     Pain Loc --      Pain  Edu? --      Excl. in GC? --     Constitutional: Alert and oriented. Well appearing and in no acute distress. Eyes: Conjunctivae are normal Head: Atraumatic HEENT: No congestion/rhinnorhea. Mucous membranes are moist.  Oropharynx non-erythematous Neck:   Nontender with no meningismus, no masses, no stridor Cardiovascular: Normal rate, regular rhythm. Grossly normal heart sounds.  Good peripheral circulation. Chest, female nurse chaperone present, positive tenderness to palpation in the exact distribution of the trellis muscle with no redness swelling crepitus felt chest or other complications noted.  When I touch this area patient states "ouch that the  pain right there" and pulls back.  There is no mass noted.  Breast exam, limited with chaperone is normal. Respiratory: Normal respiratory effort.  No retractions. Lungs CTAB. Abdominal: Soft and nontender. No distention. No guarding no rebound Back:  There is no focal tenderness or step off.  there is no midline tenderness there are no lesions noted. there is no CVA tenderness Musculoskeletal: No lower extremity tenderness, no upper extremity tenderness. No joint effusions, no DVT signs strong distal pulses no edema Neurologic:  Normal speech and language. No gross focal neurologic deficits are appreciated.  Skin:  Skin is warm, dry and intact. No rash noted. Psychiatric: Mood and affect are normal. Speech and behavior are normal.  ____________________________________________   LABS (all labs ordered are listed, but only abnormal results are displayed)  Labs Reviewed  BASIC METABOLIC PANEL - Abnormal; Notable for the following components:      Result Value   Potassium 3.3 (*)    Glucose, Bld 105 (*)    All other components within normal limits  CBC - Abnormal; Notable for the following components:   Hemoglobin 11.8 (*)    RDW 14.7 (*)    Platelets 462 (*)    All other components within normal limits  TROPONIN I  HEPATIC FUNCTION PANEL  LIPASE, BLOOD  TROPONIN I  POC URINE PREG, ED    Pertinent labs  results that were available during my care of the patient were reviewed by me and considered in my medical decision making (see chart for details). ____________________________________________  EKG  I personally interpreted any EKGs ordered by me or triage Sinus rhythm rate 86 bpm no acute ST elevation or depression normal axis unremarkable EKG ____________________________________________  RADIOLOGY  Pertinent labs & imaging results that were available during my care of the patient were reviewed by me and considered in my medical decision making (see chart for details). If  possible, patient and/or family made aware of any abnormal findings.  Dg Chest 2 View  Result Date: 07/24/2018 CLINICAL DATA:  Upper central left chest pain since this morning. EXAM: CHEST - 2 VIEW COMPARISON:  09/01/2016 FINDINGS: The heart size and mediastinal contours are within normal limits. Both lungs are clear. No pleural effusion or pneumothorax. The visualized skeletal structures are unremarkable. IMPRESSION: Normal chest radiographs. Electronically Signed   By: Amie Portlandavid  Ormond M.D.   On: 07/24/2018 15:36   ____________________________________________    PROCEDURES  Procedure(s) performed: None  Procedures  Critical Care performed: None  ____________________________________________   INITIAL IMPRESSION / ASSESSMENT AND PLAN / ED COURSE  Pertinent labs & imaging results that were available during my care of the patient were reviewed by me and considered in my medical decision making (see chart for details).  Very reproducible chest wall pain, present all day we will send a second troponin as a precaution, she is PERC negative with no  risk factors for PE and no signs or symptoms of PE or DVT, we will do a second troponin as a precaution but I think that will be negative as well and if so we will try to get her home.  Patient reassured.  We will give her pain medication.  She is in no acute distress watching TV and playing with her cell phone patient is very content with this plan    ____________________________________________   FINAL CLINICAL IMPRESSION(S) / ED DIAGNOSES  Final diagnoses:  None      This chart was dictated using voice recognition software.  Despite best efforts to proofread,  errors can occur which can change meaning.      Jeanmarie Plant, MD 07/24/18 [phone]

## 2018-07-24 NOTE — ED Triage Notes (Signed)
Pt states upper central/L CP since 9:30 this AM. States nausea but denies vomiting. Denies dizziness, diaphoresis. Alert, oriented, ambulatory. No distress noted.

## 2019-03-05 ENCOUNTER — Emergency Department
Admission: EM | Admit: 2019-03-05 | Discharge: 2019-03-05 | Disposition: A | Discharge status: Home / Self Care | Payer: 59 | Attending: Student in an Organized Health Care Education/Training Program | Admitting: Student in an Organized Health Care Education/Training Program

## 2019-03-05 ENCOUNTER — Encounter: Payer: Self-pay | Admitting: Emergency Medicine

## 2019-03-05 ENCOUNTER — Other Ambulatory Visit: Payer: Self-pay

## 2019-03-05 ENCOUNTER — Emergency Department: Payer: 59

## 2019-03-05 DIAGNOSIS — R51 Headache: Secondary | ICD-10-CM | POA: Diagnosis not present

## 2019-03-05 DIAGNOSIS — R519 Headache, unspecified: Secondary | ICD-10-CM

## 2019-03-05 LAB — POC URINE PREG, ED: PREG TEST UR: NEGATIVE

## 2019-03-05 MED ORDER — KETOROLAC TROMETHAMINE 30 MG/ML IJ SOLN: 15.0000 mg | Freq: Once | INTRAMUSCULAR | Status: DC

## 2019-03-05 MED ORDER — BUTALBITAL-APAP-CAFFEINE 50-325-40 MG PO TABS: 1.0000 | ORAL_TABLET | Freq: Four times a day (QID) | ORAL | 0 refills | Status: AC | PRN

## 2019-03-05 MED ORDER — ACETAMINOPHEN 325 MG PO TABS
650.0000 mg | ORAL_TABLET | Freq: Once | ORAL | Status: AC
  Administered 2019-03-05: 650 mg via ORAL
  Filled 2019-03-05: qty 2

## 2019-03-05 MED ORDER — PROCHLORPERAZINE EDISYLATE 10 MG/2ML IJ SOLN
10.0000 mg | Freq: Once | INTRAMUSCULAR | Status: AC
  Administered 2019-03-05: 10 mg via INTRAVENOUS
  Filled 2019-03-05: qty 2

## 2019-03-05 MED ORDER — DIPHENHYDRAMINE HCL 50 MG/ML IJ SOLN
12.5000 mg | Freq: Once | INTRAMUSCULAR | Status: AC
  Administered 2019-03-05: 12.5 mg via INTRAVENOUS
  Filled 2019-03-05: qty 1

## 2019-03-05 NOTE — Discharge Instructions (Signed)

## 2019-03-05 NOTE — ED Triage Notes (Signed)
PT c/o headache x1wk. Pt sent to [facility]. Speech clear, NAD noted

## 2019-03-05 NOTE — ED Notes (Signed)
Patient transported to CT 

## 2019-03-05 NOTE — ED Provider Notes (Signed)
[facility] Emergency Department Provider Note    First MD Initiated Contact with Patient 03/05/19 1855     (approximate)  I have reviewed the triage vital signs and the nursing notes.   HISTORY  Chief Complaint Headache    HPI Tiffany Wu is a 38 y.o. female presents the ER for evaluation of headache for the past 5 days.  States primarily in the right back of her head.  States that she did fall and hit her head while skiing roughly a week ago.  Has had increased sleepiness.  Denies any numbness or tingling.  No blurry vision.  No numbness or tingling.  No previous history of headaches or migraine.  No fevers.    History reviewed. No pertinent past medical history. No family history on file. Past Surgical History:  Procedure Laterality Date  . CESAREAN SECTION     There are no active problems to display for this patient.     Prior to Admission medications   Not on File    Allergies Patient has no known allergies.    Social History Social History   Tobacco Use  . Smoking status: Never Smoker  Substance Use Topics  . Alcohol use: No  . Drug use: No    Review of Systems Patient denies headaches, rhinorrhea, blurry vision, numbness, shortness of breath, chest pain, edema, cough, abdominal pain, nausea, vomiting, diarrhea, dysuria, fevers, rashes or hallucinations unless otherwise stated above in HPI. ____________________________________________   PHYSICAL EXAM:  VITAL SIGNS: Vitals:   03/05/19 1533  BP: (!) 163/99  Pulse: 80  Resp: 18  Temp: 98.3 F (36.8 C)  SpO2: 100%    Constitutional: Alert and oriented.  Eyes: Conjunctivae are normal.  Head: Atraumatic. Nose: No congestion/rhinnorhea. Mouth/Throat: Mucous membranes are moist.   Neck: No stridor. Painless ROM.  Cardiovascular: Normal rate, regular rhythm. Grossly normal heart sounds.  Good peripheral circulation. Respiratory: Normal respiratory effort.  No  retractions. Lungs CTAB. Gastrointestinal: Soft and nontender. No distention. No abdominal bruits. No CVA tenderness. Genitourinary:  Musculoskeletal: No lower extremity tenderness nor edema.  No joint effusions. Neurologic: CN- intact.  No facial droop, Normal FNF.  Normal heel to shin.  Sensation intact bilaterally. Normal speech and language. No gross focal neurologic deficits are appreciated. No gait instability. Skin:  Skin is warm, dry and intact. No rash noted. Psychiatric: Mood and affect are normal. Speech and behavior are normal.  ____________________________________________   LABS (all labs ordered are listed, but only abnormal results are displayed)  Results for orders placed or performed during the hospital encounter of 03/05/19 (from the past 24 hour(s))  POC urine preg, ED (not at [facility])     Status: None   Collection Time: 03/05/19  7:16 PM  Result Value Ref Range   Preg Test, Ur Negative Negative   ____________________________________________   ____________________________________________  RADIOLOGY  I personally reviewed all radiographic images ordered to evaluate for the above acute complaints and reviewed radiology reports and findings.  These findings were personally discussed with the patient.  Please see medical record for radiology report.  ____________________________________________   PROCEDURES  Procedure(s) performed:  Procedures    Critical Care performed: no ____________________________________________   INITIAL IMPRESSION / ASSESSMENT AND PLAN / ED COURSE  Pertinent labs & imaging results that were available during my care of the patient were reviewed by me and considered in my medical decision making (see chart for details).   DDX: tension, migraine, concussion, sdh, sah, mass  Tiffany Wu is a 38 y.o. who presents to the ED with headache as described above.  Patient well-appearing in no acute distress.  She is afebrile and  hemodynamically stable.  Exam is reassuring.  Given her recent trauma and persistent headache CT imaging will be ordered to evaluate for subdural bleed.  Will provide pain medication.  Will reassess.  Clinical Course as of Mar 05 2019  Thu Mar 05, 2019  2020 Improved.  CT imaging without any evidence of traumatic injury.  Patient is not pregnant.  She does not have any signs of meningismus.  No focal neuro deficits.  She is requesting discharge home and she feels improved.  She stable and appropriate for outpatient follow-up.   [PR]    Clinical Course User Index [PR] Willy Eddy, MD     As part of my medical decision making, I reviewed the following data within the electronic MEDICAL RECORD NUMBER Nursing notes reviewed and incorporated, Labs reviewed, notes from prior ED visits and [place] Controlled Substance Database   ____________________________________________   FINAL CLINICAL IMPRESSION(S) / ED DIAGNOSES  Final diagnoses:  Bad headache      NEW MEDICATIONS STARTED DURING THIS VISIT:  New Prescriptions   No medications on file     Note:  This document was prepared using Dragon voice recognition software and may include unintentional dictation errors.    Willy Eddy, MD 03/05/19 2112

## 2019-07-23 IMAGING — CR DG CHEST 2V
2 series · 2 of 2 positions shown · non-contrast
Comparison: 09/01/2016

CLINICAL DATA: Upper central left chest pain since this morning.

EXAM:
CHEST - 2 VIEW

[chest pa]
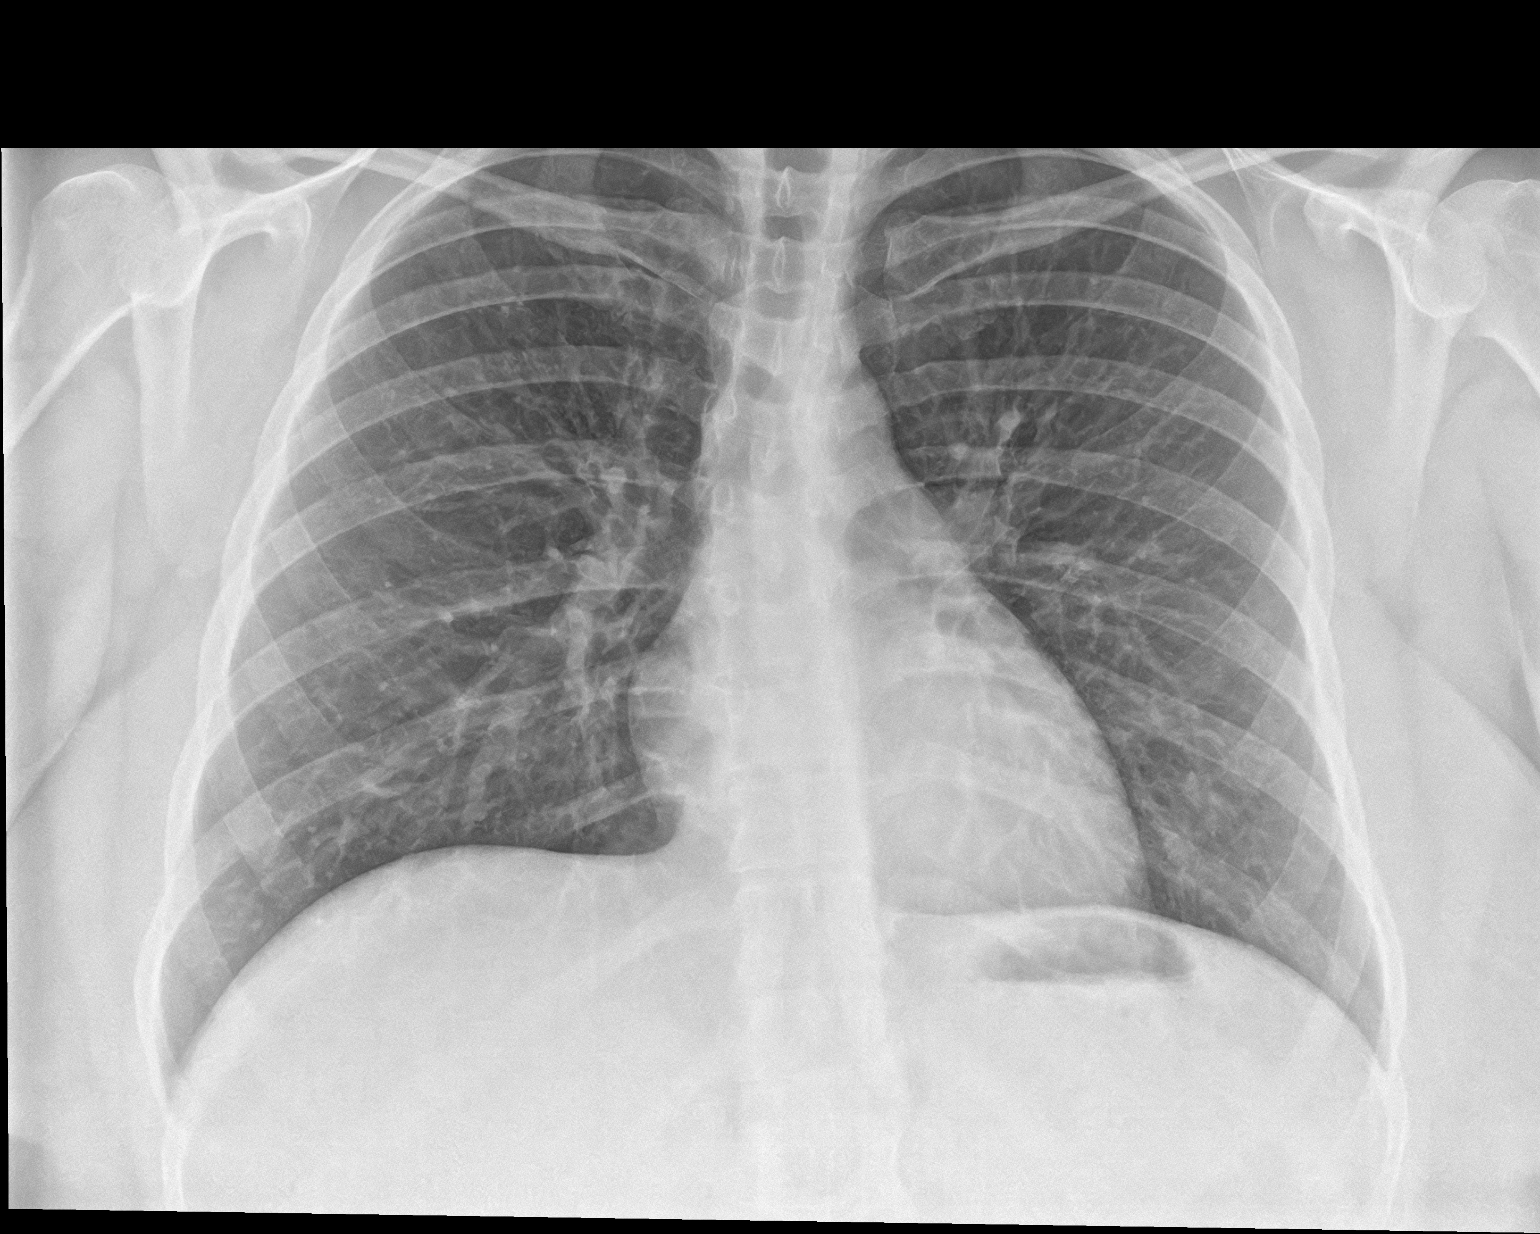

[chest lat]
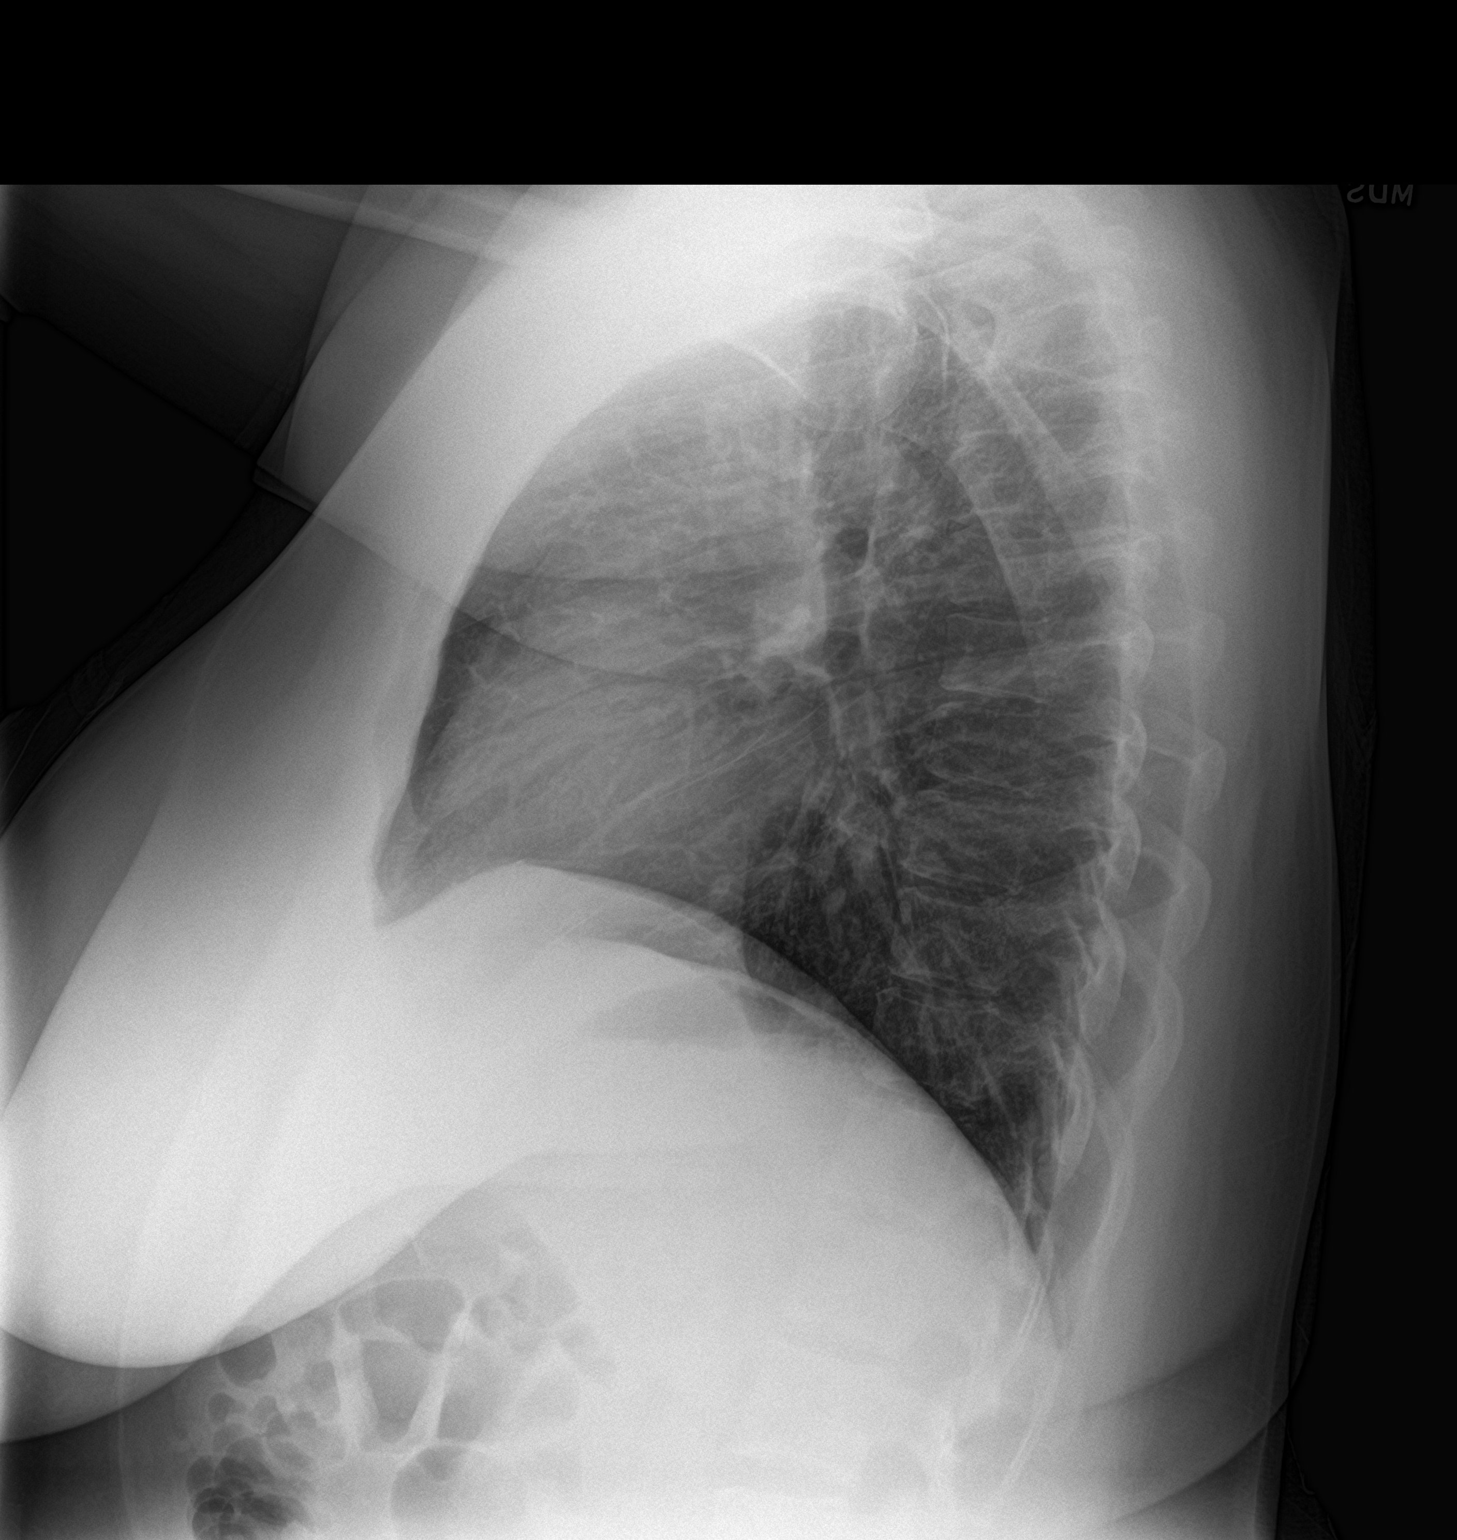

[2 of 2 positions shown; findings below may reference images not displayed]

FINDINGS: The heart size and mediastinal contours are within normal limits.
Both lungs are clear. No pleural effusion or pneumothorax. The
visualized skeletal structures are unremarkable.
IMPRESSION: Normal chest radiographs.

## 2020-03-03 IMAGING — CT CT HEAD W/O CM
4 series · 17 of 47 positions shown, 19 images · non-contrast
Comparison: Head CT 10/21/2007

CLINICAL DATA: Head trauma, minor, GCS>=13, high clinical risk,
initial exam. Headache for 1 week.

EXAM:
CT HEAD WITHOUT CONTRAST
TECHNIQUE: Contiguous axial images were obtained from the base of the skull
through the vertex without intravenous contrast.

[Series 2: head wo · axial · 0.40mm/px · z∈[-135,-25]mm · 7 of 30 slices shown, 9 images]
[im 4/30  brain]
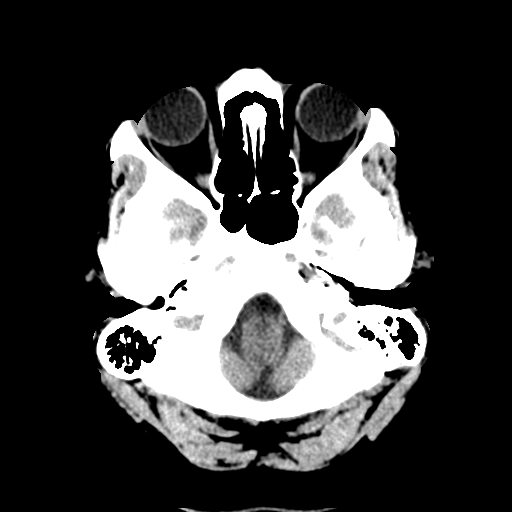
[im 4/30  bone]
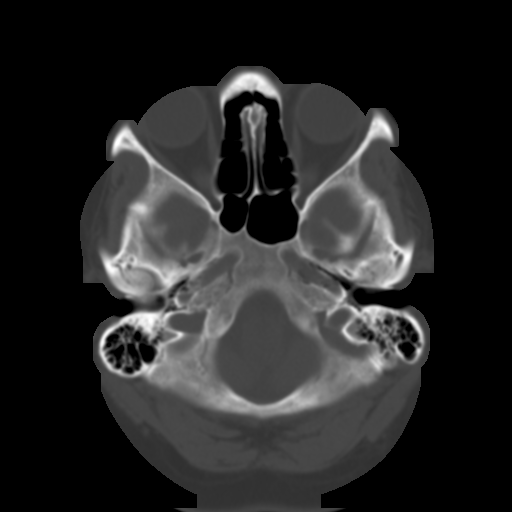
[im 8/30  brain]
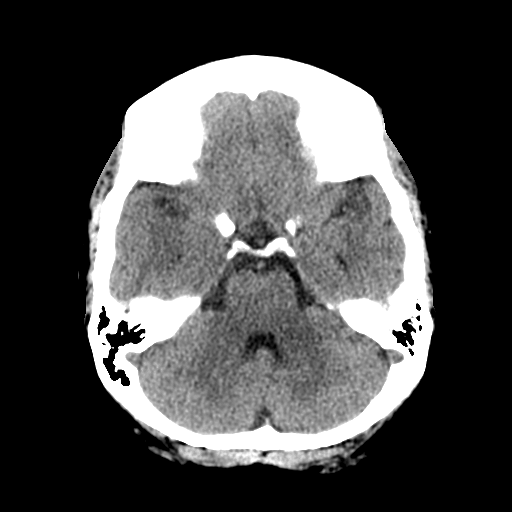
[im 11/30  brain]
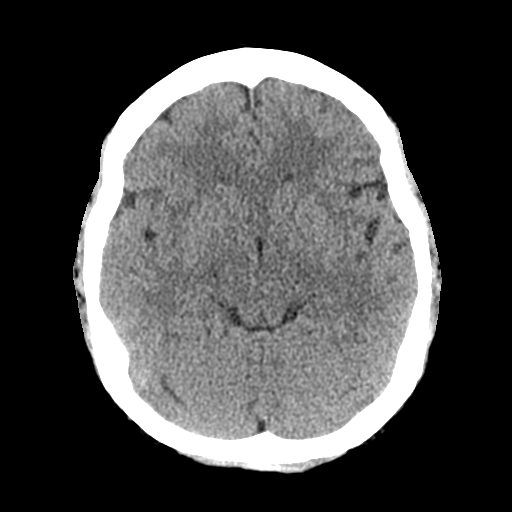
[im 15/30  brain]
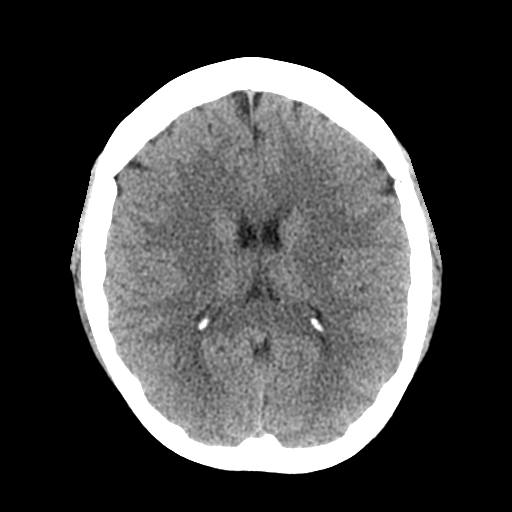
[im 19/30  brain]
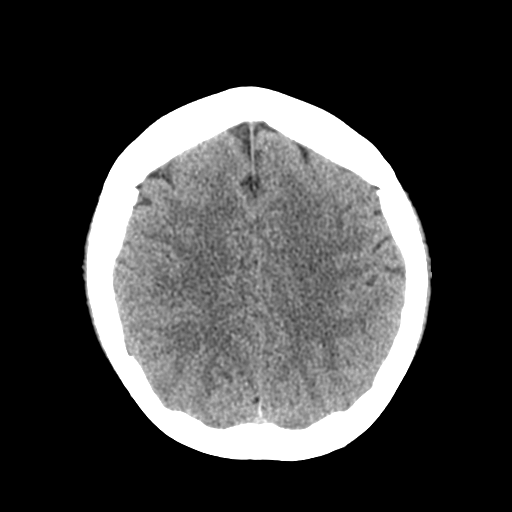
[im 19/30  bone]
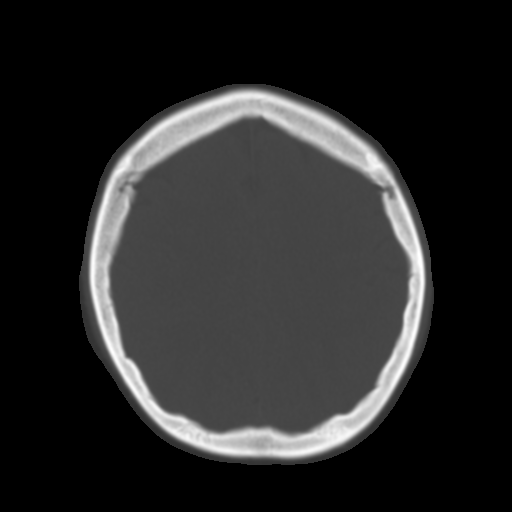
[im 22/30  brain]
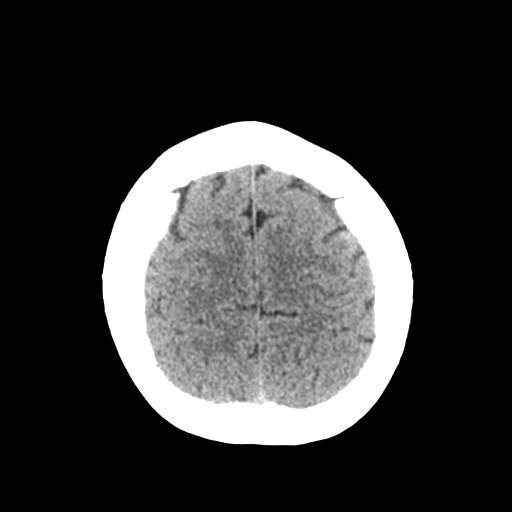
[im 26/30  brain]
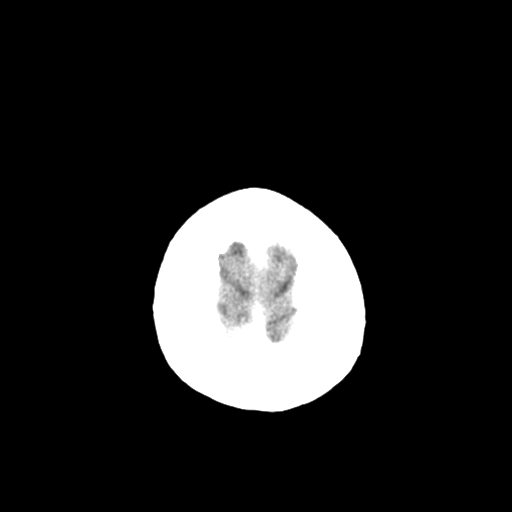

[Series 3: head bone · axial · 0.40mm/px · z∈[-136,-84]mm · 4 of 75 slices shown]
[im 8/75  bone]
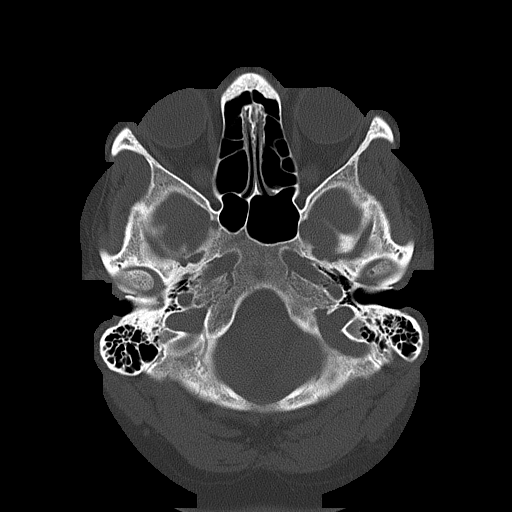
[im 15/75  bone]
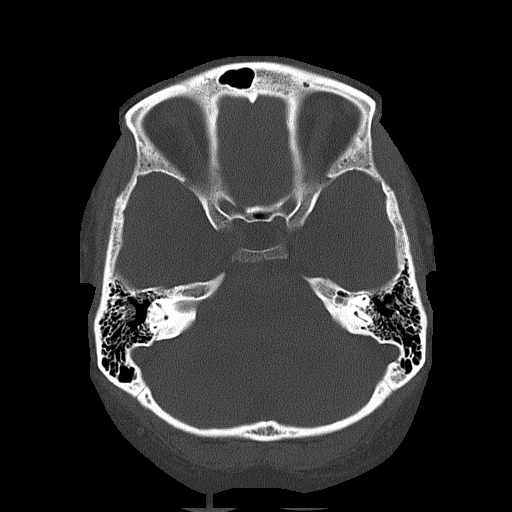
[im 23/75  bone]
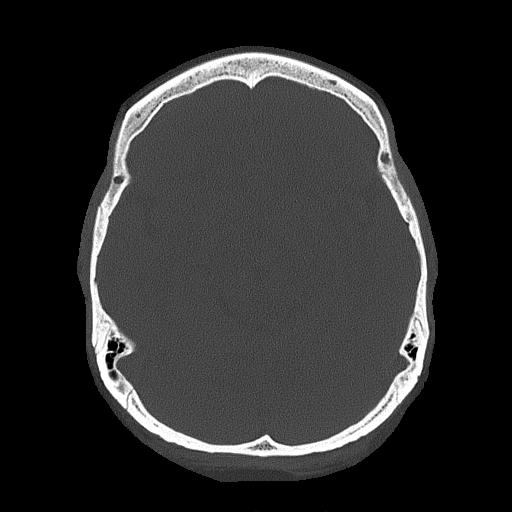
[im 34/75  bone]
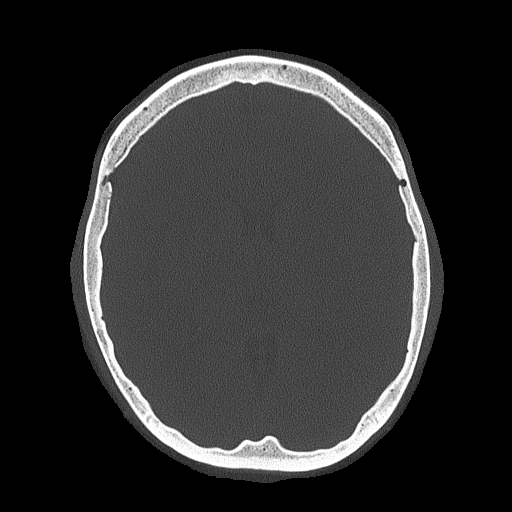

[Series 4: coronal soft tissue · coronal · 0.35mm/px · 3 of 61 slices shown]
[im 21/61  brain]
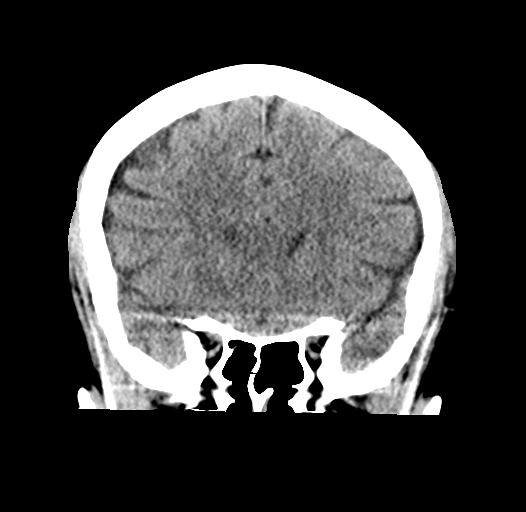
[im 27/61  brain]
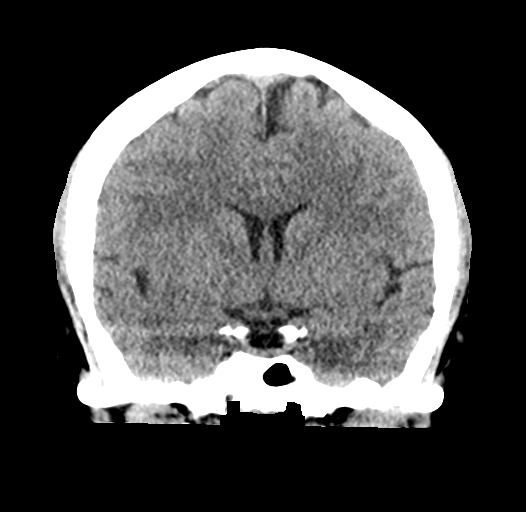
[im 34/61  brain]
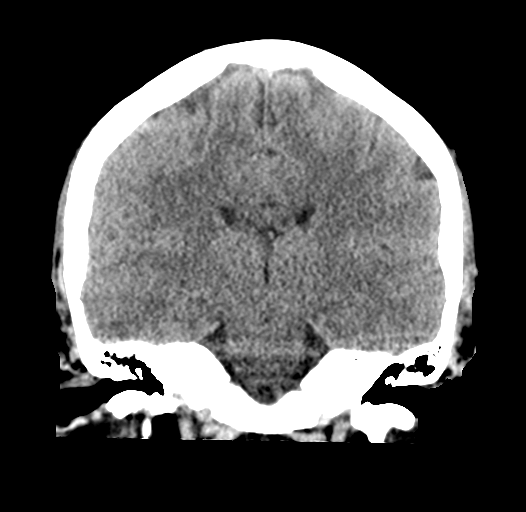

[Series 5: sagittal soft tissue · sagittal · 0.33mm/px · 3 of 53 slices shown]
[im 18/53  brain]
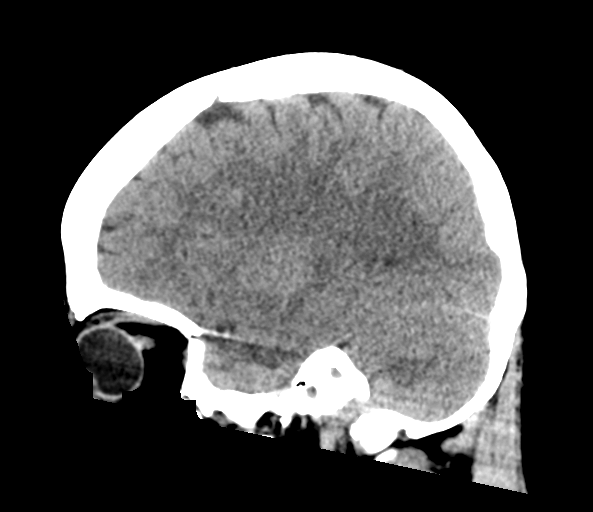
[im 27/53  brain]
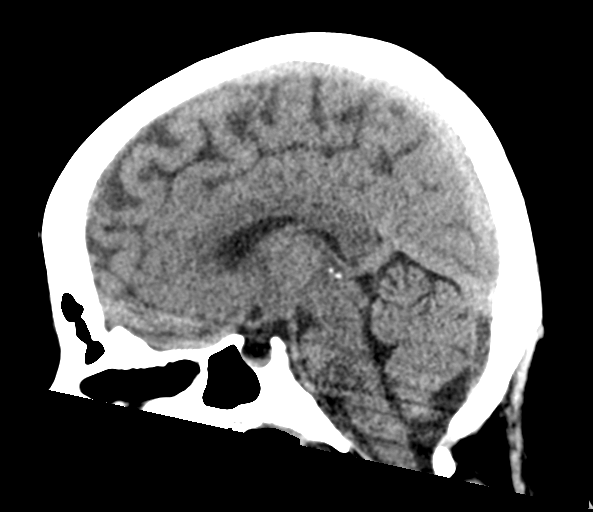
[im 35/53  brain]
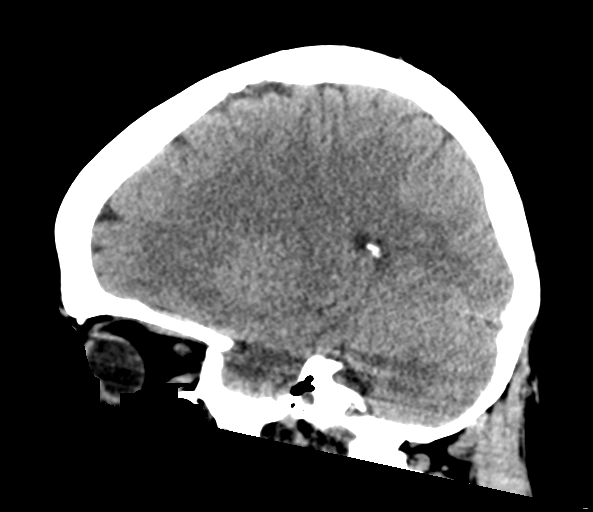

[17 of 47 positions shown; findings below may reference images not displayed]

FINDINGS: Brain: No intracranial hemorrhage, mass effect, or midline shift. No
hydrocephalus. The basilar cisterns are patent. No evidence of
territorial infarct or acute ischemia. No extra-axial or
intracranial fluid collection.

Vascular: No hyperdense vessel or unexpected calcification.

Skull: No fracture or focal lesion.

Sinuses/Orbits: Paranasal sinuses and mastoid air cells are clear.
The visualized orbits are unremarkable.

Other: None.
IMPRESSION: Normal noncontrast head CT.

## 2022-11-29 ENCOUNTER — Other Ambulatory Visit: Payer: Self-pay

## 2022-11-29 DIAGNOSIS — N764 Abscess of vulva: Principal | ICD-10-CM | POA: Diagnosis present

## 2022-11-29 DIAGNOSIS — R110 Nausea: Secondary | ICD-10-CM | POA: Diagnosis present

## 2022-11-29 NOTE — ED Triage Notes (Signed)
Pt comes from home via POV c/o abscess on vagina. Pt states she had an ingrown hair and popped it 2 weeks ago. Pt states labia is swollen and hurts to walk. Pt in NAD, denies CP and SOB

## 2022-11-30 ENCOUNTER — Inpatient Hospital Stay
Admission: EM | Admit: 2022-11-30 | Discharge: 2022-12-01 | DRG: 747 | Disposition: A | Discharge status: Home / Self Care | Payer: 59 | Attending: Obstetrics and Gynecology | Admitting: Obstetrics and Gynecology

## 2022-11-30 DIAGNOSIS — N764 Abscess of vulva: Secondary | ICD-10-CM | POA: Diagnosis present

## 2022-11-30 LAB — CBC WITH DIFFERENTIAL/PLATELET
Abs Immature Granulocytes: 0.05 10*3/uL (ref 0.00–0.07)
Basophils Absolute: 0.1 10*3/uL (ref 0.0–0.1)
Basophils Relative: 1 %
Eosinophils Absolute: 0.2 10*3/uL (ref 0.0–0.5)
Eosinophils Relative: 1 %
HCT: 33.3 % — ABNORMAL LOW (ref 36.0–46.0)
Hemoglobin: 10 g/dL — ABNORMAL LOW (ref 12.0–15.0)
Immature Granulocytes: 0 %
Lymphocytes Relative: 16 %
Lymphs Abs: 2 10*3/uL (ref 0.7–4.0)
MCH: 24.6 pg — ABNORMAL LOW (ref 26.0–34.0)
MCHC: 30 g/dL (ref 30.0–36.0)
MCV: 82 fL (ref 80.0–100.0)
Monocytes Absolute: 0.9 10*3/uL (ref 0.1–1.0)
Monocytes Relative: 7 %
Neutro Abs: 9.8 10*3/uL — ABNORMAL HIGH (ref 1.7–7.7)
Neutrophils Relative %: 75 %
Platelets: 496 10*3/uL — ABNORMAL HIGH (ref 150–400)
RBC: 4.06 MIL/uL (ref 3.87–5.11)
RDW: 14.6 % (ref 11.5–15.5)
WBC: 13 10*3/uL — ABNORMAL HIGH (ref 4.0–10.5)
nRBC: 0 % (ref 0.0–0.2)

## 2022-11-30 LAB — COMPREHENSIVE METABOLIC PANEL
ALT: 12 U/L (ref 0–44)
AST: 20 U/L (ref 15–41)
Albumin: 3.7 g/dL (ref 3.5–5.0)
Alkaline Phosphatase: 73 U/L (ref 38–126)
Anion gap: 7 (ref 5–15)
BUN: 13 mg/dL (ref 6–20)
CO2: 24 mmol/L (ref 22–32)
Calcium: 9 mg/dL (ref 8.9–10.3)
Chloride: 107 mmol/L (ref 98–111)
Creatinine, Ser: 0.74 mg/dL (ref 0.44–1.00)
GFR, Estimated: >60 mL/min (ref >60)
Glucose, Bld: 105 mg/dL — ABNORMAL HIGH (ref 70–99)
Potassium: 3.5 mmol/L (ref 3.5–5.1)
Sodium: 138 mmol/L (ref 135–145)
Total Bilirubin: 0.4 mg/dL (ref 0.3–1.2)
Total Protein: 7.7 g/dL (ref 6.5–8.1)

## 2022-11-30 MED ORDER — ONDANSETRON HCL 4 MG/2ML IJ SOLN
4.0000 mg | Freq: Once | INTRAMUSCULAR | Status: AC
  Administered 2022-11-30: 4 mg via INTRAVENOUS
  Filled 2022-11-30: qty 2

## 2022-11-30 MED ORDER — VANCOMYCIN HCL IN DEXTROSE 1-5 GM/200ML-% IV SOLN
1000.0000 mg | Freq: Two times a day (BID) | INTRAVENOUS | Status: DC
  Administered 2022-11-30 – 2022-12-01 (×2): 1000 mg via INTRAVENOUS
  Filled 2022-11-30 (×3): qty 200

## 2022-11-30 MED ORDER — FENTANYL CITRATE PF 50 MCG/ML IJ SOSY
50.0000 ug | PREFILLED_SYRINGE | Freq: Once | INTRAMUSCULAR | Status: AC
  Administered 2022-11-30: 50 ug via INTRAVENOUS
  Filled 2022-11-30: qty 1

## 2022-11-30 MED ORDER — DOXYCYCLINE MONOHYDRATE 100 MG PO TABS: 100.0000 mg | ORAL_TABLET | Freq: Two times a day (BID) | ORAL | 0 refills | Status: DC

## 2022-11-30 MED ORDER — SODIUM CHLORIDE 0.9 % IV SOLN
12.5000 mg | Freq: Four times a day (QID) | INTRAVENOUS | Status: DC | PRN
  Administered 2022-11-30: 12.5 mg via INTRAVENOUS
  Filled 2022-11-30: qty 12.5

## 2022-11-30 MED ORDER — LIDOCAINE-EPINEPHRINE 2 %-1:100000 IJ SOLN
20.0000 mL | Freq: Once | INTRAMUSCULAR | Status: AC
  Administered 2022-11-30: 20 mL
  Filled 2022-11-30: qty 1

## 2022-11-30 MED ORDER — MIDAZOLAM HCL 2 MG/2ML IJ SOLN
2.0000 mg | Freq: Once | INTRAMUSCULAR | Status: AC
  Administered 2022-11-30: 2 mg via INTRAVENOUS
  Filled 2022-11-30: qty 2

## 2022-11-30 MED ORDER — HYDROMORPHONE HCL 1 MG/ML IJ SOLN
1.0000 mg | Freq: Once | INTRAMUSCULAR | Status: AC
  Administered 2022-11-30: 1 mg via INTRAVENOUS
  Filled 2022-11-30: qty 1

## 2022-11-30 MED ORDER — OXYCODONE-ACETAMINOPHEN 5-325 MG PO TABS
1.0000 | ORAL_TABLET | ORAL | Status: DC | PRN
  Administered 2022-11-30: 1 via ORAL
  Administered 2022-12-01: 2 via ORAL
  Filled 2022-11-30 (×2): qty 2

## 2022-11-30 MED ORDER — KETOROLAC TROMETHAMINE 30 MG/ML IJ SOLN
30.0000 mg | Freq: Four times a day (QID) | INTRAMUSCULAR | Status: AC
  Administered 2022-11-30 – 2022-12-01 (×4): 30 mg via INTRAVENOUS
  Filled 2022-11-30 (×4): qty 1

## 2022-11-30 MED ORDER — VANCOMYCIN HCL 2000 MG/400ML IV SOLN
2000.0000 mg | Freq: Once | INTRAVENOUS | Status: AC
  Administered 2022-11-30: 2000 mg via INTRAVENOUS
  Filled 2022-11-30: qty 400

## 2022-11-30 MED ORDER — ONDANSETRON HCL 4 MG/2ML IJ SOLN
4.0000 mg | Freq: Four times a day (QID) | INTRAMUSCULAR | Status: DC | PRN
  Administered 2022-11-30 (×2): 4 mg via INTRAVENOUS
  Filled 2022-11-30 (×3): qty 2

## 2022-11-30 MED ORDER — SENNA 8.6 MG PO TABS
1.0000 | ORAL_TABLET | Freq: Two times a day (BID) | ORAL | Status: DC
  Administered 2022-11-30: 8.6 mg via ORAL
  Filled 2022-11-30 (×3): qty 1

## 2022-11-30 MED ORDER — HYDROMORPHONE HCL 1 MG/ML IJ SOLN
0.2000 mg | INTRAMUSCULAR | Status: DC | PRN
  Administered 2022-11-30 (×2): 0.5 mg via INTRAVENOUS
  Filled 2022-11-30 (×2): qty 0.5

## 2022-11-30 MED ORDER — ACETAMINOPHEN 325 MG PO TABS
650.0000 mg | ORAL_TABLET | ORAL | Status: DC | PRN
  Administered 2022-11-30: 650 mg via ORAL
  Filled 2022-11-30: qty 2

## 2022-11-30 MED ORDER — ONDANSETRON HCL 4 MG PO TABS: 4.0000 mg | ORAL_TABLET | Freq: Four times a day (QID) | ORAL | Status: DC | PRN

## 2022-11-30 MED ORDER — PIPERACILLIN-TAZOBACTAM 3.375 G IVPB: 3.3750 g | Freq: Three times a day (TID) | INTRAVENOUS | Status: DC

## 2022-11-30 MED ORDER — AMOXICILLIN-POT CLAVULANATE 875-125 MG PO TABS: 1.0000 | ORAL_TABLET | Freq: Two times a day (BID) | ORAL | 0 refills | Status: DC

## 2022-11-30 MED ORDER — SODIUM CHLORIDE 0.9 % IV SOLN: INTRAVENOUS | Status: DC | PRN

## 2022-11-30 MED ORDER — VANCOMYCIN HCL IN DEXTROSE 1-5 GM/200ML-% IV SOLN
1000.0000 mg | Freq: Two times a day (BID) | INTRAVENOUS | Status: DC
  Filled 2022-11-30: qty 200

## 2022-11-30 MED ORDER — PIPERACILLIN-TAZOBACTAM 3.375 G IVPB 30 MIN
3.3750 g | Freq: Once | INTRAVENOUS | Status: AC
Start: 2022-11-30 — End: 2022-11-30
  Administered 2022-11-30: 3.375 g via INTRAVENOUS
  Filled 2022-11-30: qty 50

## 2022-11-30 NOTE — ED Provider Notes (Addendum)
[facility] Provider Note    Event Date/Time   First MD Initiated Contact with Patient 11/30/22 0145     (approximate)   History   Abscess   HPI  Tiffany Wu is a 41 y.o. female no past medical history presents with vulvar abscess.  Patient notes that she popped an ingrown hair on her left labia majora about 2 weeks ago.  Since that time it has been becoming progressively swollen and painful.  Particularly worsened over the last 2 days.  She has no fevers chills or other systemic symptoms.  Is quite painful to ambulate.  Denies history of similar.     History reviewed. No pertinent past medical history.  There are no problems to display for this patient.    Physical Exam  Triage Vital Signs: ED Triage Vitals  Enc Vitals Group     BP 11/29/22 2300 (!) 154/106     Pulse Rate 11/29/22 2300 (!) 109     Resp 11/29/22 2300 18     Temp 11/29/22 2300 98.8 F (37.1 C)     Temp Source 11/29/22 2300 Oral     SpO2 11/29/22 2300 98 %     Weight 11/29/22 2259 180 lb (81.6 kg)     Height 11/29/22 2259 5\' 2"  (1.575 m)     Head Circumference --      Peak Flow --      Pain Score 11/29/22 2259 8     Pain Loc --      Pain Edu? --      Excl. in GC? --     Most recent vital signs: Vitals:   11/30/22 0530 11/30/22 0630  BP: 136/78 135/75  Pulse: (!) 108 (!) 105  Resp: 18 18  Temp:    SpO2: 99% 98%     General: Awake, no distress.  CV:  Good peripheral perfusion.  Resp:  Normal effort.  Abd:  No distention.  Neuro:             Awake, Alert, Oriented x 3  Other:  Left labia majora and left side of the mons are significantly swollen and erythematous there is area of fluctuance with small focus of purulence on the lateral aspect she is extremely tender to touch no crepitus no perineal involvement   ED Results / Procedures / Treatments  Labs (all labs ordered are listed, but only abnormal results are displayed) Labs Reviewed  COMPREHENSIVE  METABOLIC PANEL - Abnormal; Notable for the following components:      Result Value   Glucose, Bld 105 (*)    All other components within normal limits  CBC WITH DIFFERENTIAL/PLATELET - Abnormal; Notable for the following components:   WBC 13.0 (*)    Hemoglobin 10.0 (*)    HCT 33.3 (*)    MCH 24.6 (*)    Platelets 496 (*)    Neutro Abs 9.8 (*)    All other components within normal limits     EKG     RADIOLOGY    PROCEDURES:  Critical Care performed: No  ..Incision and Drainage  Date/Time: 11/30/2022 4:31 AM  Performed by: 14/12/2021, MD Authorized by: Georga Hacking, MD   Consent:    Consent obtained:  Verbal   Risks discussed:  Incomplete drainage, pain and bleeding Universal protocol:    Patient identity confirmed:  Verbally with patient Location:    Type:  Abscess   Location:  Anogenital   Anogenital location:  Vulva Pre-procedure details:    Skin preparation:  Povidone-iodine Sedation:    Sedation type:  Anxiolysis Anesthesia:    Anesthesia method:  Local infiltration   Local anesthetic:  Lidocaine 2% WITH epi Procedure type:    Complexity:  Complex Procedure details:    Ultrasound guidance: no     Needle aspiration: no     Incision types:  Single straight   Incision depth:  Subcutaneous   Drainage:  Purulent   Drainage amount:  Moderate   Wound treatment:  Wound left open   Packing materials:  1/4 in iodoform gauze Post-procedure details:    Procedure completion:  Tolerated   The patient is on the cardiac monitor to evaluate for evidence of arrhythmia and/or significant heart rate changes.   MEDICATIONS ORDERED IN ED: Medications  vancomycin (VANCOREADY) IVPB 2000 mg/400 mL (0 mg Intravenous Stopped 11/30/22 0513)  piperacillin-tazobactam (ZOSYN) IVPB 3.375 g (0 g Intravenous Stopped 11/30/22 0304)  lidocaine-EPINEPHrine (XYLOCAINE W/EPI) 2 %-1:100000 (with pres) injection 20 mL (20 mLs Infiltration Given by Other 11/30/22 0247)   HYDROmorphone (DILAUDID) injection 1 mg (1 mg Intravenous Given 11/30/22 0231)  midazolam (VERSED) injection 2 mg (2 mg Intravenous Given 11/30/22 0428)  fentaNYL (SUBLIMAZE) injection 50 mcg (50 mcg Intravenous Given 11/30/22 0429)  ondansetron (ZOFRAN) injection 4 mg (4 mg Intravenous Given 11/30/22 0429)  HYDROmorphone (DILAUDID) injection 1 mg (1 mg Intravenous Given 11/30/22 0604)     IMPRESSION / MDM / ASSESSMENT AND PLAN / ED COURSE  I reviewed the triage vital signs and the nursing notes.                              Patient's presentation is most consistent with acute complicated illness / injury requiring diagnostic workup.  Differential diagnosis includes, but is not limited to, vulvar abscess, cellulitis, less likely necrotizing infection patient is a 41 year old female presents with an abscess on the vulva.  Has had pain and swelling after popping a ingrown hair on the labia majora about 2 weeks ago.  She is tachycardic on arrival with otherwise stable vital signs.  She has fairly significant swelling of the left labia majora tracking up to the left side of the mons that is erythematous and there is an area of fluctuance on the lateral aspect.  I did perform bedside ultrasound she has about a 2 cm deep abscess.  Plan to check labs given the tachycardia and extent.  Will give first dose of antibiotics IV.  Will perform I&D.    Patient was given dose of vancomycin and Zosyn.  Labs revealed leukocytosis.  I performed a bedside I&D and confirmed with ultrasound complete drainage.  The wound was deloculated fully.  There was copious return of purulence.  I think that given Obtain source control with abscess I&D and patient has received the first dose of IV antibiotics that she can.  Appropriately treated with oral antibiotics as an outpatient.  I did discuss with her the importance of adherence to antibiotics and sitz bath's over the next several days.  Also encouraged follow-up by either  OB/GYN or if she is not able to get an appointment with them return to ED in about 2 to 3 days for repeat evaluation of the infection.  Discussed reasons to return earlier including worsening pain expansion of erythema or fever.  Patient understood.   Upon attempted discharge patient was in significant discomfort.  She has required multiple doses  of IV opiates and is still tachycardic this point do not think discharge is appropriate.  I have consulted OB/GYN and patient will be admitted.  FINAL CLINICAL IMPRESSION(S) / ED DIAGNOSES   Final diagnoses:  Vulvar abscess     Rx / DC Orders   ED Discharge Orders          Ordered    doxycycline (ADOXA) 100 MG tablet  2 times daily        11/30/22 0535    amoxicillin-clavulanate (AUGMENTIN) 875-125 MG tablet  2 times daily        11/30/22 0535             Note:  This document was prepared using Dragon voice recognition software and may include unintentional dictation errors.   Georga Hacking, MD 11/30/22 0535    Georga Hacking, MD 11/30/22 [phone]

## 2022-11-30 NOTE — Progress Notes (Signed)
Patient ID: Tiffany Wu, female   DOB: 11/19/1981, 41 y.o.   MRN: [id number] Up to date with admission by CNM   I have seen the patient  Significant vulvar edema, left . She is able to urinate without difficulty .  I+D in ED   No culture  Vancomycin started q 12 for possible MRSA  Ice pack to perineum

## 2022-11-30 NOTE — Discharge Instructions (Signed)
Please take the 2 antibiotics twice a day for the next 7 days.  Please either follow-up with OB/GYN or if you are not able to be seen by them return to the ED in about 2 to 3 days to have repeat evaluation of your wound.  These do warm soaks 3-4 times per day to help with healing.  You can take Tylenol and ibuprofen for pain.  If the redness and swelling are worsening despite being on antibiotics please return to the emergency department.

## 2022-11-30 NOTE — H&P (Signed)
Consult History and Physical   SERVICE: Gynecology   Patient Name: Tiffany Wu Patient MRN:   [id number]  CC: labial pain  HPI: Tiffany Wu is a 41 y.o. G4P4 with left labial abscess - noted infected hair follicle about 2wks ago, was able to get the hair out, pain and redness worsened.  - about 3d ago, noted pain and redness/swelling dramatic increase.  - has been using warm compresses at home, no drainage from her left labia.  - has never had a similar issue in the past - reports last pelvic exam/Pap was likely with her last pregnancy 18yrs ago, does not have a Gyn or primary care provider.   Review of Systems: positives in bold GEN:   fevers, chills, weight changes, appetite changes, fatigue, night sweats HEENT:  HA, vision changes, hearing loss, congestion, rhinorrhea, sinus pressure, dysphagia CV:   CP, palpitations PULM:  SOB, cough GI:  abd pain, N/V/D/C GU: denies dysuria, urgency, frequency; denies vaginal discharge, itching/burning, No pelvic pain.    Past Obstetrical History: OB History   No obstetric history on file.     Past Gynecologic History: Patient's last menstrual period was 11/15/2022 (approximate). Menstrual frequency Q 4 wks; regular, no concerns.  - sexually active, no new partner - s/p BTL with her 4th CS 74yrs ago.   Past Medical History: History reviewed. No pertinent past medical history.  Past Surgical History:   Past Surgical History:  Procedure Laterality Date   CESAREAN SECTION      Family History:  family history is not on file.  Social History:  Social History   Socioeconomic History   Marital status: Married    Spouse name: Not on file   Number of children: Not on file   Years of education: Not on file   Highest education level: Not on file  Occupational History   Not on file  Tobacco Use   Smoking status: Never   Smokeless tobacco: Not on file  Substance and Sexual Activity   Alcohol use: No   Drug use: No    Sexual activity: Not on file  Other Topics Concern   Not on file  Social History Narrative   Not on file   Social Determinants of Health   Financial Resource Strain: Not on file  Food Insecurity: Not on file  Transportation Needs: Not on file  Physical Activity: Not on file  Stress: Not on file  Social Connections: Not on file  Intimate Partner Violence: Not on file    Home Medications:  Medications reconciled in EPIC  No current facility-administered medications on file prior to encounter.   No current outpatient medications on file prior to encounter.    Allergies:  No Known Allergies  Physical Exam:  Temp:  [98 F (36.7 C)-98.8 F (37.1 C)] 98 F (36.7 C) (12/01 0430) Pulse Rate:  [102-111] 105 (12/01 0630) Resp:  [14-18] 18 (12/01 0630) BP: (135-154)/(75-106) 135/75 (12/01 0630) SpO2:  [98 %-99 %] 98 % (12/01 0630) Weight:  [81.6 kg] 81.6 kg (11/30 2259)   General Appearance:  Well developed, well nourished, no acute distress, alert and oriented x3 HEENT:  Normocephalic atraumatic Cardiovascular:  Normal S1/S2, regular rate and rhythm, no murmurs Pulmonary:  clear to auscultation, no wheezes, rales or rhonchi, symmetric air entry, good air exchange Abdomen:  Bowel sounds present, soft, nontender, nondistended, no abnormal masses, no epigastric pain Extremities:  Full range of motion, no pedal edema, 2+ distal pulses, no tenderness Skin:  normal coloration  and turgor, no rashes, no suspicious skin lesions noted  Psychiatric:  Normal mood and affect, appropriate Pelvic:   - Left vulvar/Labia significant erythema with induration approx 14cm length x 7cm width. Approx 1cm incision with scant dark red drainage, packed with 1/4" iodoform gauze.  - extremely tender. Pt c/o 8/10 pain after last pain medication given in ER.   Labs/Studies:   CBC and Coags:  Lab Results  Component Value Date   WBC 13.0 (H) 11/30/2022   NEUTOPHILPCT 75 11/30/2022   EOSPCT 1  11/30/2022   BASOPCT 1 11/30/2022   LYMPHOPCT 16 11/30/2022   HGB 10.0 (L) 11/30/2022   HCT 33.3 (L) 11/30/2022   MCV 82.0 11/30/2022   PLT 496 (H) 11/30/2022   CMP:  Lab Results  Component Value Date   NA 138 11/30/2022   K 3.5 11/30/2022   CL 107 11/30/2022   CO2 24 11/30/2022   BUN 13 11/30/2022   CREATININE 0.74 11/30/2022   CREATININE 0.79 07/24/2018   CREATININE 0.72 09/02/2016   PROT 7.7 11/30/2022   BILITOT 0.4 11/30/2022   BILIDIR <0.1 07/24/2018   ALT 12 11/30/2022   AST 20 11/30/2022   ALKPHOS 73 11/30/2022       Assessment / Plan:   Zamaya Kirchoff is a 41 y.o.  who presents with vulvar abscess  1. D/w Dr Ouida Sills, pt to be admitted for IV Abx and pain mgmt.  2. Zosyn started in ER, pharmacy consult for ongoing abx.  3. Pain mgmt ordered with tylenol, percocet, and prn IV Dilaudid.     Francetta Found, CNM 11/30/2022 7:29 AM

## 2022-11-30 NOTE — Progress Notes (Signed)
S: Notified by RN that patient has continued nausea despite taking Zofran q6hrs. Patient would like something additionally for nausea.  O: Today's Vitals   11/30/22 1650 11/30/22 1941 11/30/22 1942 11/30/22 2000  BP:   130/88   Pulse:   100   Resp:   16   Temp:   98.4 F (36.9 C)   TempSrc:   Oral   SpO2:   98%   Weight:      Height:      PainSc: 8  8   1     Body mass index is 32.92 kg/m.  No fevers. No tachycardia.  A: Left genital labial abscess  P: Added Phenergan 12.5mg  IV q6hrs to help with continued nausea to administer if Zofran not relieving nausea. Continue scheduled antibiotics and pain medications at this time. CBC in the morning.   , CNM 11/30/2022 10:00 PM

## 2022-12-01 LAB — CBC
HCT: 30.3 % — ABNORMAL LOW (ref 36.0–46.0)
Hemoglobin: 9.4 g/dL — ABNORMAL LOW (ref 12.0–15.0)
MCH: 25.1 pg — ABNORMAL LOW (ref 26.0–34.0)
MCHC: 31 g/dL (ref 30.0–36.0)
MCV: 80.8 fL (ref 80.0–100.0)
Platelets: 435 10*3/uL — ABNORMAL HIGH (ref 150–400)
RBC: 3.75 MIL/uL — ABNORMAL LOW (ref 3.87–5.11)
RDW: 14.5 % (ref 11.5–15.5)
WBC: 10 10*3/uL (ref 4.0–10.5)
nRBC: 0 % (ref 0.0–0.2)

## 2022-12-01 MED ORDER — OXYCODONE HCL 5 MG PO TABS: 5.0000 mg | ORAL_TABLET | Freq: Three times a day (TID) | ORAL | 0 refills | Status: AC | PRN

## 2022-12-01 MED ORDER — ACETAMINOPHEN 325 MG PO TABS: 650.0000 mg | ORAL_TABLET | Freq: Four times a day (QID) | ORAL | Status: AC | PRN

## 2022-12-01 MED ORDER — SULFAMETHOXAZOLE-TRIMETHOPRIM 800-160 MG PO TABS: 1.0000 | ORAL_TABLET | Freq: Two times a day (BID) | ORAL | 0 refills | Status: AC

## 2022-12-01 MED ORDER — ONDANSETRON 4 MG PO TBDP: 4.0000 mg | ORAL_TABLET | Freq: Three times a day (TID) | ORAL | 0 refills | Status: AC | PRN

## 2022-12-01 NOTE — Progress Notes (Signed)
Patient discharged. Discharge instructions given. Patient verbalizes understanding. Transported by staff. 

## 2022-12-01 NOTE — Progress Notes (Addendum)
Obstetric and Gynecology  HD # 2  Subjective  Patient c/o of fatigue, and HA.  At this time she is tolerating PO intake, tolerating labial pain with PO meds, ambulating without difficulty, voiding spontaneously.     Denies CP, SOB, F/C, N/V/D, or leg pain.   Objective   Objective:   Vitals:   11/30/22 1942 11/30/22 2343 12/01/22 0315 12/01/22 0748  BP: 130/88 123/79 120/82 113/75  Pulse: 100 96 66 78  Resp: 16 18 18 18   Temp: 98.4 F (36.9 C) 98.5 F (36.9 C) 97.9 F (36.6 C) 97.8 F (36.6 C)  TempSrc: Oral Oral Oral Oral  SpO2: 98% 96% 99% 95%  Weight:      Height:       Temp:  [97.8 F (36.6 C)-98.5 F (36.9 C)] 97.8 F (36.6 C) (12/02 0748) Pulse Rate:  [66-101] 78 (12/02 0748) Resp:  [16-18] 18 (12/02 0748) BP: (113-130)/(75-88) 113/75 (12/02 0748) SpO2:  [95 %-99 %] 95 % (12/02 0748) I/O last 3 completed shifts: In: 41.4 [IV Piggyback:41.4] Out: -  No intake/output data recorded. No intake or output data in the 24 hours ending 12/01/22 0914   Current Vital Signs 24h Vital Sign Ranges  T 97.8 F (36.6 C) Temp  Avg: 98.1 F (36.7 C)  Min: 97.8 F (36.6 C)  Max: 98.5 F (36.9 C)  BP 113/75 BP  Min: 113/75  Max: 130/88  HR 78 Pulse  Avg: 91.4  Min: 66  Max: 101  RR 18 Resp  Avg: 17.6  Min: 16  Max: 18  SaO2 95 % Room Air SpO2  Avg: 96.6 %  Min: 95 %  Max: 99 %           24 Hour I/O Current Shift I/O  Time Ins Outs No intake/output data recorded. No intake/output data recorded.   General: NAD Cardiovascular: RRR Pulmonary: normal respiratory effort Abdomen: Benign. Non-tender,  Extremities: No erythema or cords, no calf tenderness, with normal peripheral pulses. Left Labia: swelling noted with no current drainage   Labs: Results for orders placed or performed during the hospital encounter of 11/30/22 (from the past 24 hour(s))  CBC     Status: Abnormal   Collection Time: 12/01/22  6:03 AM  Result Value Ref Range   WBC 10.0 4.0 - 10.5 K/uL    RBC 3.75 (L) 3.87 - 5.11 MIL/uL   Hemoglobin 9.4 (L) 12.0 - 15.0 g/dL   HCT 14/02/23 (L) 70.9 - 62.8 %   MCV 80.8 80.0 - 100.0 fL   MCH 25.1 (L) 26.0 - 34.0 pg   MCHC 31.0 30.0 - 36.0 g/dL   RDW 36.6 29.4 - 76.5 %   Platelets 435 (H) 150 - 400 K/uL   nRBC 0.0 0.0 - 0.2 %    Cultures: No results found for this or any previous visit.  Imaging: No results found.   Assessment   41 y.o.  Hospital Day: 2 for left labial abscess and cellulitis  Plan   1. Left labial abscess  Vancomycin 1g q12hr 2. Pain management - patient reports labial pain is resolved the labial pain but not the headache  Dilaudid - last given yesterday at 1941  Toradol - last given today at 0309  Percocet - last given yesterday 1442  Tylenol - last given yesterday at 1121  Continuing ice pack 3. Nausea  Zofran - last given yesterday at 1824  Phenergan - last given yesterday at 2242 4. Output - pt reports she  is able to void

## 2022-12-01 NOTE — Discharge Summary (Addendum)
DC Summary Discharge Summary   Patient ID: Tiffany Wu [id number] 41 y.o. 1981-11-01  Admit date: 11/30/2022  Discharge date: 12/01/2022  Principal Diagnoses:  Vulvar abscess [N76.4] Left genital labial abscess [N76.4]   Secondary Diagnoses:  Patient Active Problem List   Diagnosis Date Noted   Left genital labial abscess 11/30/2022    Procedures performed during the hospitalization: I&D  HPI:  left labial abscess - noted infected hair follicle about 2wks ago, was able to get the hair out, pain and redness worsened.  - about 3d ago, noted pain and redness/swelling dramatic increase.  - has been using warm compresses at home, no drainage from her left labia.  - has never had a similar issue in the past - reports last pelvic exam/Pap was likely with her last pregnancy 83yrs ago, does not have a Gyn or primary care provider.   History reviewed. No pertinent past medical history.  Past Surgical History:  Procedure Laterality Date   CESAREAN SECTION      No Known Allergies  Social History   Tobacco Use   Smoking status: Never  Substance Use Topics   Alcohol use: No   Drug use: No    History reviewed. No pertinent family history.  Hospital Course:  I&D in ED IV ABX and pain management  Discharge Exam: BP 129/81 (BP Location: Left Arm)   Pulse 99   Temp 98.6 F (37 C) (Oral)   Resp 18   Ht 5\' 2"  (1.575 m)   Wt 81.6 kg   LMP 11/15/2022 (Approximate)   SpO2 95%   BMI 32.92 kg/m   General: NAD CV: RRR Pulm: nl effort ABD: s/nd/nt Labia:edema   Condition at Discharge: Stable  Complications affecting treatment: None  Discharge Medications:  Allergies as of 12/01/2022   No Known Allergies      Medication List     TAKE these medications    acetaminophen 325 MG tablet Commonly known as: TYLENOL Take 2 tablets (650 mg total) by mouth every 6 (six) hours as needed for mild pain.   ondansetron 4 MG disintegrating tablet Commonly known  as: ZOFRAN-ODT Take 1 tablet (4 mg total) by mouth every 8 (eight) hours as needed for nausea or vomiting.   oxyCODONE 5 MG immediate release tablet Commonly known as: Roxicodone Take 1 tablet (5 mg total) by mouth every 8 (eight) hours as needed.   sulfamethoxazole-trimethoprim 800-160 MG tablet Commonly known as: BACTRIM DS Take 1 tablet by mouth 2 (two) times daily.         Follow-up arrangements:   Follow-up Information     Schermerhorn, 14/01/2022, MD. Schedule an appointment as soon as possible for a visit in 10 day(s).   Specialty: Obstetrics and Gynecology Why: For wound re-check Contact information: [address] [place] [phone]                  Discharge Disposition: Discharge disposition: 01-Home or Self Care    Signed: [phone], CNM  12/01/2022 1:54 PM

## 2023-04-02 DIAGNOSIS — E6609 Other obesity due to excess calories: Secondary | ICD-10-CM | POA: Diagnosis not present

## 2023-04-02 DIAGNOSIS — Z1321 Encounter for screening for nutritional disorder: Secondary | ICD-10-CM | POA: Diagnosis not present

## 2023-04-02 DIAGNOSIS — Z13220 Encounter for screening for lipoid disorders: Secondary | ICD-10-CM | POA: Diagnosis not present

## 2023-04-02 DIAGNOSIS — Z131 Encounter for screening for diabetes mellitus: Secondary | ICD-10-CM | POA: Diagnosis not present

## 2023-04-02 DIAGNOSIS — Z1329 Encounter for screening for other suspected endocrine disorder: Secondary | ICD-10-CM | POA: Diagnosis not present

## 2023-04-02 DIAGNOSIS — N898 Other specified noninflammatory disorders of vagina: Secondary | ICD-10-CM | POA: Diagnosis not present

## 2023-04-02 DIAGNOSIS — Z6839 Body mass index (BMI) 39.0-39.9, adult: Secondary | ICD-10-CM | POA: Diagnosis not present

## 2023-04-02 DIAGNOSIS — L729 Follicular cyst of the skin and subcutaneous tissue, unspecified: Secondary | ICD-10-CM | POA: Diagnosis not present

## 2023-04-02 DIAGNOSIS — G43009 Migraine without aura, not intractable, without status migrainosus: Secondary | ICD-10-CM | POA: Diagnosis not present

## 2023-04-02 DIAGNOSIS — Z130 Encounter for screening for diseases of the blood and blood-forming organs and certain disorders involving the immune mechanism: Secondary | ICD-10-CM | POA: Diagnosis not present

## 2023-04-02 DIAGNOSIS — Z0000 Encounter for general adult medical examination without abnormal findings: Secondary | ICD-10-CM | POA: Diagnosis not present

## 2023-04-02 DIAGNOSIS — Z1159 Encounter for screening for other viral diseases: Secondary | ICD-10-CM | POA: Diagnosis not present

## 2023-04-02 DIAGNOSIS — Z124 Encounter for screening for malignant neoplasm of cervix: Secondary | ICD-10-CM | POA: Diagnosis not present

## 2023-05-01 DIAGNOSIS — G43009 Migraine without aura, not intractable, without status migrainosus: Secondary | ICD-10-CM | POA: Diagnosis not present

## 2023-05-01 DIAGNOSIS — Z6838 Body mass index (BMI) 38.0-38.9, adult: Secondary | ICD-10-CM | POA: Diagnosis not present

## 2023-05-01 DIAGNOSIS — E6609 Other obesity due to excess calories: Secondary | ICD-10-CM | POA: Diagnosis not present

## 2023-06-05 DIAGNOSIS — H5213 Myopia, bilateral: Secondary | ICD-10-CM | POA: Diagnosis not present

## 2023-06-10 DIAGNOSIS — H5202 Hypermetropia, left eye: Secondary | ICD-10-CM | POA: Diagnosis not present

## 2023-06-11 DIAGNOSIS — D492 Neoplasm of unspecified behavior of bone, soft tissue, and skin: Secondary | ICD-10-CM | POA: Diagnosis not present

## 2023-06-11 DIAGNOSIS — W57XXXA Bitten or stung by nonvenomous insect and other nonvenomous arthropods, initial encounter: Secondary | ICD-10-CM | POA: Diagnosis not present

## 2023-06-11 DIAGNOSIS — S40862A Insect bite (nonvenomous) of left upper arm, initial encounter: Secondary | ICD-10-CM | POA: Diagnosis not present

## 2023-06-11 DIAGNOSIS — S40861A Insect bite (nonvenomous) of right upper arm, initial encounter: Secondary | ICD-10-CM | POA: Diagnosis not present

## 2023-06-11 DIAGNOSIS — D2362 Other benign neoplasm of skin of left upper limb, including shoulder: Secondary | ICD-10-CM | POA: Diagnosis not present

## 2023-08-23 DIAGNOSIS — Z6838 Body mass index (BMI) 38.0-38.9, adult: Secondary | ICD-10-CM | POA: Diagnosis not present

## 2023-08-23 DIAGNOSIS — E6609 Other obesity due to excess calories: Secondary | ICD-10-CM | POA: Diagnosis not present

## 2023-08-23 DIAGNOSIS — Z23 Encounter for immunization: Secondary | ICD-10-CM | POA: Diagnosis not present

## 2023-09-23 DIAGNOSIS — Z20822 Contact with and (suspected) exposure to covid-19: Secondary | ICD-10-CM | POA: Diagnosis not present

## 2023-09-23 DIAGNOSIS — A084 Viral intestinal infection, unspecified: Secondary | ICD-10-CM | POA: Diagnosis not present

## 2023-10-26 DIAGNOSIS — M25521 Pain in right elbow: Secondary | ICD-10-CM | POA: Diagnosis not present

## 2023-10-26 DIAGNOSIS — S52044A Nondisplaced fracture of coronoid process of right ulna, initial encounter for closed fracture: Secondary | ICD-10-CM | POA: Diagnosis not present

## 2023-10-28 DIAGNOSIS — M25422 Effusion, left elbow: Secondary | ICD-10-CM | POA: Diagnosis not present

## 2023-10-28 DIAGNOSIS — S52042A Displaced fracture of coronoid process of left ulna, initial encounter for closed fracture: Secondary | ICD-10-CM | POA: Diagnosis not present

## 2023-10-28 DIAGNOSIS — M25522 Pain in left elbow: Secondary | ICD-10-CM | POA: Diagnosis not present

## 2023-10-31 DIAGNOSIS — M25521 Pain in right elbow: Secondary | ICD-10-CM | POA: Diagnosis not present

## 2023-10-31 DIAGNOSIS — S52044A Nondisplaced fracture of coronoid process of right ulna, initial encounter for closed fracture: Secondary | ICD-10-CM | POA: Diagnosis not present

## 2023-10-31 DIAGNOSIS — S56211A Strain of other flexor muscle, fascia and tendon at forearm level, right arm, initial encounter: Secondary | ICD-10-CM | POA: Diagnosis not present

## 2023-10-31 DIAGNOSIS — S52091A Other fracture of upper end of right ulna, initial encounter for closed fracture: Secondary | ICD-10-CM | POA: Diagnosis not present

## 2023-10-31 DIAGNOSIS — S53441A Ulnar collateral ligament sprain of right elbow, initial encounter: Secondary | ICD-10-CM | POA: Diagnosis not present

## 2023-11-04 DIAGNOSIS — E6609 Other obesity due to excess calories: Secondary | ICD-10-CM | POA: Diagnosis not present

## 2023-11-04 DIAGNOSIS — Z23 Encounter for immunization: Secondary | ICD-10-CM | POA: Diagnosis not present

## 2023-11-04 DIAGNOSIS — Z6839 Body mass index (BMI) 39.0-39.9, adult: Secondary | ICD-10-CM | POA: Diagnosis not present

## 2023-11-04 DIAGNOSIS — E66812 Obesity, class 2: Secondary | ICD-10-CM | POA: Diagnosis not present

## 2024-01-07 DIAGNOSIS — E6609 Other obesity due to excess calories: Secondary | ICD-10-CM | POA: Diagnosis not present

## 2024-01-07 DIAGNOSIS — Z6839 Body mass index (BMI) 39.0-39.9, adult: Secondary | ICD-10-CM | POA: Diagnosis not present

## 2024-01-07 DIAGNOSIS — E66812 Obesity, class 2: Secondary | ICD-10-CM | POA: Diagnosis not present

## 2024-01-29 DIAGNOSIS — R509 Fever, unspecified: Secondary | ICD-10-CM | POA: Diagnosis not present

## 2024-01-29 DIAGNOSIS — Z20822 Contact with and (suspected) exposure to covid-19: Secondary | ICD-10-CM | POA: Diagnosis not present

## 2024-01-29 DIAGNOSIS — R059 Cough, unspecified: Secondary | ICD-10-CM | POA: Diagnosis not present

## 2024-01-30 DIAGNOSIS — R059 Cough, unspecified: Secondary | ICD-10-CM | POA: Diagnosis not present

## 2024-01-30 DIAGNOSIS — R509 Fever, unspecified: Secondary | ICD-10-CM | POA: Diagnosis not present

## 2024-02-12 DIAGNOSIS — G43809 Other migraine, not intractable, without status migrainosus: Secondary | ICD-10-CM | POA: Diagnosis not present

## 2024-02-12 DIAGNOSIS — Z79899 Other long term (current) drug therapy: Secondary | ICD-10-CM | POA: Diagnosis not present

## 2024-02-27 DIAGNOSIS — Z789 Other specified health status: Secondary | ICD-10-CM | POA: Diagnosis not present

## 2024-03-06 DIAGNOSIS — E6609 Other obesity due to excess calories: Secondary | ICD-10-CM | POA: Diagnosis not present

## 2024-03-06 DIAGNOSIS — Z6837 Body mass index (BMI) 37.0-37.9, adult: Secondary | ICD-10-CM | POA: Diagnosis not present

## 2024-03-06 DIAGNOSIS — E66812 Obesity, class 2: Secondary | ICD-10-CM | POA: Diagnosis not present

## 2024-03-10 ENCOUNTER — Telehealth: Payer: Self-pay

## 2024-03-10 DIAGNOSIS — G43809 Other migraine, not intractable, without status migrainosus: Secondary | ICD-10-CM

## 2024-04-21 ENCOUNTER — Telehealth: Payer: Self-pay

## 2024-04-21 DIAGNOSIS — G43809 Other migraine, not intractable, without status migrainosus: Secondary | ICD-10-CM

## 2024-04-21 NOTE — Patient Outreach (Signed)
 Complex Care Management Note Care Guide Note  04/21/2024 Name: Merilynn Haydu MRN: [id number] DOB: September 22, 1981   Complex Care Management Outreach Attempts: An unsuccessful telephone outreach was attempted today to offer the patient information about available complex care management services.  Follow Up Plan:  Additional outreach attempts will be made to offer the patient complex care management information and services.   Encounter Outcome:  No Answer  Carter Clare, BSW, CDP   [facility] - Population Health Manager Population Health Direct Dial: [phone]  Fax: [phone]

## 2024-04-27 ENCOUNTER — Telehealth: Payer: Self-pay

## 2024-04-27 NOTE — Progress Notes (Signed)
 Thank you for this referral. The [facility] team receives referrals for eligible patients: all patients with [facility] Primary Care Provider (any health plan including Medicaid or uninsured) and patients with a [facility] Primary Care Provider AND contracted health plan.     This patient does not have a [facility] or [facility] Primary Care Provider. [facility] Population [facility] cannot directly provide Care Management services to patients who do not have a qualifying Primary Care Provider. Please call us if you need further clarification at [phone].     [facility]  [facility], [facility] Health Care Management Assistant Direct Dial: [phone]  Fax: [phone]

## 2024-05-13 DIAGNOSIS — Z6836 Body mass index (BMI) 36.0-36.9, adult: Secondary | ICD-10-CM | POA: Diagnosis not present

## 2024-05-13 DIAGNOSIS — E66812 Obesity, class 2: Secondary | ICD-10-CM | POA: Diagnosis not present

## 2024-05-13 DIAGNOSIS — E6609 Other obesity due to excess calories: Secondary | ICD-10-CM | POA: Diagnosis not present

## 2024-05-13 DIAGNOSIS — Z1231 Encounter for screening mammogram for malignant neoplasm of breast: Secondary | ICD-10-CM | POA: Diagnosis not present

## 2024-05-18 DIAGNOSIS — Z1231 Encounter for screening mammogram for malignant neoplasm of breast: Secondary | ICD-10-CM | POA: Diagnosis not present

## 2024-10-12 DIAGNOSIS — S20211A Contusion of right front wall of thorax, initial encounter: Secondary | ICD-10-CM | POA: Diagnosis not present

## 2024-10-14 DIAGNOSIS — E6609 Other obesity due to excess calories: Secondary | ICD-10-CM | POA: Diagnosis not present

## 2024-10-14 DIAGNOSIS — E66812 Obesity, class 2: Secondary | ICD-10-CM | POA: Diagnosis not present

## 2024-10-14 DIAGNOSIS — S29009A Unspecified injury of muscle and tendon of unspecified wall of thorax, initial encounter: Secondary | ICD-10-CM | POA: Diagnosis not present

## 2024-10-14 DIAGNOSIS — R0789 Other chest pain: Secondary | ICD-10-CM | POA: Diagnosis not present

## 2024-10-14 DIAGNOSIS — Z23 Encounter for immunization: Secondary | ICD-10-CM | POA: Diagnosis not present

## 2024-10-14 DIAGNOSIS — Z6837 Body mass index (BMI) 37.0-37.9, adult: Secondary | ICD-10-CM | POA: Diagnosis not present

## 2024-10-22 DIAGNOSIS — H5201 Hypermetropia, right eye: Secondary | ICD-10-CM | POA: Diagnosis not present
